# Patient Record
Sex: Female | Born: 1935 | Race: White | Hispanic: No | Smoking: Former smoker
Health system: Southern US, Community
[De-identification: ages and names within clinical notes are randomized; demographics above are authoritative.]

## PROBLEM LIST (undated history)

## (undated) DIAGNOSIS — I10 Essential (primary) hypertension: Secondary | ICD-10-CM

## (undated) DIAGNOSIS — E119 Type 2 diabetes mellitus without complications: Secondary | ICD-10-CM

## (undated) DIAGNOSIS — J449 Chronic obstructive pulmonary disease, unspecified: Secondary | ICD-10-CM

## (undated) DIAGNOSIS — M1990 Unspecified osteoarthritis, unspecified site: Secondary | ICD-10-CM

## (undated) DIAGNOSIS — N289 Disorder of kidney and ureter, unspecified: Secondary | ICD-10-CM

---

## 2016-12-28 ENCOUNTER — Emergency Department (HOSPITAL_COMMUNITY): Payer: Medicare Other

## 2016-12-28 ENCOUNTER — Inpatient Hospital Stay (HOSPITAL_COMMUNITY)
Admission: EM | Admit: 2016-12-28 | Discharge: 2017-01-04 | DRG: 194 | Disposition: A | Discharge status: Home / Self Care | Payer: Medicare Other | Attending: Internal Medicine | Admitting: Internal Medicine

## 2016-12-28 ENCOUNTER — Encounter (HOSPITAL_COMMUNITY): Payer: Self-pay | Admitting: Emergency Medicine

## 2016-12-28 DIAGNOSIS — Z7984 Long term (current) use of oral hypoglycemic drugs: Secondary | ICD-10-CM

## 2016-12-28 DIAGNOSIS — E119 Type 2 diabetes mellitus without complications: Secondary | ICD-10-CM | POA: Diagnosis not present

## 2016-12-28 DIAGNOSIS — Z7902 Long term (current) use of antithrombotics/antiplatelets: Secondary | ICD-10-CM

## 2016-12-28 DIAGNOSIS — J189 Pneumonia, unspecified organism: Principal | ICD-10-CM | POA: Diagnosis present

## 2016-12-28 DIAGNOSIS — M7989 Other specified soft tissue disorders: Secondary | ICD-10-CM | POA: Diagnosis present

## 2016-12-28 DIAGNOSIS — I10 Essential (primary) hypertension: Secondary | ICD-10-CM | POA: Diagnosis present

## 2016-12-28 DIAGNOSIS — Z881 Allergy status to other antibiotic agents status: Secondary | ICD-10-CM

## 2016-12-28 DIAGNOSIS — Z880 Allergy status to penicillin: Secondary | ICD-10-CM

## 2016-12-28 DIAGNOSIS — J440 Chronic obstructive pulmonary disease with acute lower respiratory infection: Secondary | ICD-10-CM | POA: Diagnosis present

## 2016-12-28 DIAGNOSIS — Z87891 Personal history of nicotine dependence: Secondary | ICD-10-CM

## 2016-12-28 DIAGNOSIS — H9190 Unspecified hearing loss, unspecified ear: Secondary | ICD-10-CM | POA: Diagnosis present

## 2016-12-28 DIAGNOSIS — W19XXXA Unspecified fall, initial encounter: Secondary | ICD-10-CM | POA: Diagnosis present

## 2016-12-28 DIAGNOSIS — Z79899 Other long term (current) drug therapy: Secondary | ICD-10-CM

## 2016-12-28 DIAGNOSIS — Z7982 Long term (current) use of aspirin: Secondary | ICD-10-CM

## 2016-12-28 DIAGNOSIS — R410 Disorientation, unspecified: Secondary | ICD-10-CM | POA: Diagnosis present

## 2016-12-28 DIAGNOSIS — E1151 Type 2 diabetes mellitus with diabetic peripheral angiopathy without gangrene: Secondary | ICD-10-CM | POA: Diagnosis present

## 2016-12-28 DIAGNOSIS — J449 Chronic obstructive pulmonary disease, unspecified: Secondary | ICD-10-CM | POA: Diagnosis present

## 2016-12-28 DIAGNOSIS — Z888 Allergy status to other drugs, medicaments and biological substances status: Secondary | ICD-10-CM

## 2016-12-28 DIAGNOSIS — J441 Chronic obstructive pulmonary disease with (acute) exacerbation: Secondary | ICD-10-CM | POA: Diagnosis present

## 2016-12-28 HISTORY — DX: Essential (primary) hypertension: I10

## 2016-12-28 HISTORY — DX: Disorder of kidney and ureter, unspecified: N28.9

## 2016-12-28 HISTORY — DX: Chronic obstructive pulmonary disease, unspecified: J44.9

## 2016-12-28 HISTORY — DX: Type 2 diabetes mellitus without complications: E11.9

## 2016-12-28 HISTORY — DX: Unspecified osteoarthritis, unspecified site: M19.90

## 2016-12-28 LAB — CBC WITH DIFFERENTIAL/PLATELET
Basophils Absolute: 0 10*3/uL (ref 0.0–0.1)
Basophils Relative: 0 %
EOS PCT: 0 %
Eosinophils Absolute: 0 10*3/uL (ref 0.0–0.7)
HEMATOCRIT: 39.2 % (ref 36.0–46.0)
Hemoglobin: 13.4 g/dL (ref 12.0–15.0)
LYMPHS ABS: 0.4 10*3/uL — AB (ref 0.7–4.0)
LYMPHS PCT: 5 %
MCH: 32.1 pg (ref 26.0–34.0)
MCHC: 34.2 g/dL (ref 30.0–36.0)
MCV: 94 fL (ref 78.0–100.0)
MONO ABS: 0.4 10*3/uL (ref 0.1–1.0)
MONOS PCT: 4 %
Neutro Abs: 8.6 10*3/uL — ABNORMAL HIGH (ref 1.7–7.7)
Neutrophils Relative %: 91 %
PLATELETS: 210 10*3/uL (ref 150–400)
RBC: 4.17 MIL/uL (ref 3.87–5.11)
RDW: 13.7 % (ref 11.5–15.5)
WBC: 9.4 10*3/uL (ref 4.0–10.5)

## 2016-12-28 LAB — URINALYSIS, ROUTINE W REFLEX MICROSCOPIC
Bilirubin Urine: NEGATIVE
Glucose, UA: NEGATIVE mg/dL
KETONES UR: 20 mg/dL — AB
Leukocytes, UA: NEGATIVE
Nitrite: NEGATIVE
PH: 5 (ref 5.0–8.0)
Protein, ur: 100 mg/dL — AB
SPECIFIC GRAVITY, URINE: 1.018 (ref 1.005–1.030)

## 2016-12-28 LAB — COMPREHENSIVE METABOLIC PANEL
ALBUMIN: 3.6 g/dL (ref 3.5–5.0)
ALT: 26 U/L (ref 14–54)
AST: 44 U/L — AB (ref 15–41)
Alkaline Phosphatase: 69 U/L (ref 38–126)
Anion gap: 9 (ref 5–15)
BILIRUBIN TOTAL: 1.5 mg/dL — AB (ref 0.3–1.2)
BUN: 15 mg/dL (ref 6–20)
CHLORIDE: 104 mmol/L (ref 101–111)
CO2: 23 mmol/L (ref 22–32)
Calcium: 8.5 mg/dL — ABNORMAL LOW (ref 8.9–10.3)
Creatinine, Ser: 0.81 mg/dL (ref 0.44–1.00)
GFR calc Af Amer: >60 mL/min (ref >60)
GFR calc non Af Amer: >60 mL/min (ref >60)
GLUCOSE: 156 mg/dL — AB (ref 65–99)
POTASSIUM: 3.6 mmol/L (ref 3.5–5.1)
SODIUM: 136 mmol/L (ref 135–145)
Total Protein: 7 g/dL (ref 6.5–8.1)

## 2016-12-28 LAB — I-STAT CG4 LACTIC ACID, ED: LACTIC ACID, VENOUS: 1.09 mmol/L (ref 0.5–1.9)

## 2016-12-28 LAB — BRAIN NATRIURETIC PEPTIDE: B NATRIURETIC PEPTIDE 5: 58 pg/mL (ref 0.0–100.0)

## 2016-12-28 LAB — MAGNESIUM: Magnesium: 1.8 mg/dL (ref 1.7–2.4)

## 2016-12-28 LAB — TROPONIN I: TROPONIN I: 0.03 ng/mL — AB (ref <0.03)

## 2016-12-28 MED ORDER — BENZONATATE 100 MG PO CAPS
100.0000 mg | ORAL_CAPSULE | Freq: Once | ORAL | Status: AC
  Administered 2016-12-28: 100 mg via ORAL
  Filled 2016-12-28: qty 1

## 2016-12-28 MED ORDER — IPRATROPIUM-ALBUTEROL 0.5-2.5 (3) MG/3ML IN SOLN
3.0000 mL | RESPIRATORY_TRACT | Status: DC
  Administered 2016-12-29 (×4): 3 mL via RESPIRATORY_TRACT
  Filled 2016-12-28 (×4): qty 3

## 2016-12-28 MED ORDER — DEXTROSE 5 % IV SOLN
1.0000 g | Freq: Once | INTRAVENOUS | Status: AC
  Administered 2016-12-28: 1 g via INTRAVENOUS
  Filled 2016-12-28: qty 10

## 2016-12-28 MED ORDER — ACETAMINOPHEN 325 MG PO TABS
650.0000 mg | ORAL_TABLET | Freq: Once | ORAL | Status: AC
  Administered 2016-12-28: 650 mg via ORAL
  Filled 2016-12-28: qty 2

## 2016-12-28 MED ORDER — SODIUM CHLORIDE 0.9 % IV BOLUS (SEPSIS)
1000.0000 mL | Freq: Once | INTRAVENOUS | Status: AC
  Administered 2016-12-28: 1000 mL via INTRAVENOUS

## 2016-12-28 MED ORDER — DEXTROSE 5 % IV SOLN
500.0000 mg | Freq: Once | INTRAVENOUS | Status: AC
  Administered 2016-12-28: 500 mg via INTRAVENOUS
  Filled 2016-12-28: qty 500

## 2016-12-28 MED ORDER — ASPIRIN 81 MG PO CHEW
324.0000 mg | CHEWABLE_TABLET | Freq: Once | ORAL | Status: AC
Start: 2016-12-28 — End: 2016-12-28
  Administered 2016-12-28: 324 mg via ORAL
  Filled 2016-12-28: qty 4

## 2016-12-28 MED ORDER — KETOROLAC TROMETHAMINE 30 MG/ML IJ SOLN
15.0000 mg | Freq: Once | INTRAMUSCULAR | Status: AC
  Administered 2016-12-28: 15 mg via INTRAVENOUS
  Filled 2016-12-28: qty 1

## 2016-12-28 NOTE — ED Provider Notes (Signed)
[facility]-EMER[facility]Y DEPT Provider Note   CSN: [id number] Arrival date & time: 12/28/16  2005   By signing my name below, I, Madison Curry, attest that this documentation has been prepared under the direction and in the presence of [name], Barbara Cower, MD. Electronically signed, Madison Curry, ED Scribe. 12/28/16. 8:39 PM.   History   Chief Complaint Chief Complaint  Patient presents with  . Fever   The history is provided by the patient, medical records and a relative. No language interpreter was used.    Madison Curry is a 83 y.o. female BIB EMS, with h/o HTN, DM and COPD, who presents to the Emergency Department with concern for a new, persistent fever (tMax 103 measured at home via axilla) noticed today. Associated fidgeting, cough, non focal weakness, fatigue, acute on chronic back pain exacerbated from recent falls and intermittent diarrhea x 2 days all allegedly progressing and persisting over the past 2-3 days. Pt allegedly fell out of bed 5 days ago and yesterday, per herself and relative. Wounds noted near both wrists d/t these falls. No h/o similar symptoms noted. Pt visiting from out of town; she does not where oxygen chronically. No treatments noted at home. No other modifying factors noted. Multiple sick contacts with cough. No diarrhea, frequency, significant leg swelling, chest pain, abdominal pain or any other complaints noted at this time.     Past Medical History:  Diagnosis Date  . Arthritis   . COPD (chronic obstructive pulmonary disease) (HCC)   . Diabetes mellitus without complication (HCC)   . Hypertension   . Renal disorder     Patient Active Problem List   Diagnosis Date Noted  . PNA (pneumonia) 12/28/2016  . Delirium 12/28/2016  . COPD (chronic obstructive pulmonary disease) (HCC)   . Diabetes mellitus without complication (HCC)   . Hypertension     History reviewed. No pertinent surgical history.  OB History    No data available       Home  Medications    Prior to Admission medications   Medication Sig Start Date End Date Taking? Authorizing Provider  albuterol (PROVENTIL) (2.5 MG/3ML) 0.083% nebulizer solution Take 2.5 mg by nebulization every 6 (six) hours as needed for wheezing or shortness of breath.   Yes [provider]  amLODipine (NORVASC) 5 MG tablet Take 5 mg by mouth every morning.   Yes [provider]  aspirin (GNP ADULT ASPIRIN LOW STRENGTH) 81 MG chewable tablet Chew 81 mg by mouth every evening.   Yes [provider]  azelastine (ASTELIN) 0.1 % nasal spray Place 2 sprays into both nostrils at bedtime as needed for rhinitis. Use in each nostril as directed   Yes [provider]  BIOTIN PO Take 1 capsule by mouth every evening.   Yes [provider]  calcium carbonate (CALCIUM 600) 1500 (600 Ca) MG TABS tablet Take 1 tablet by mouth 2 (two) times daily with a meal.   Yes [provider]  cetirizine (ZYRTEC) 10 MG tablet Take 10 mg by mouth every morning.   Yes [provider]  chlorpheniramine (CHLOR-TRIMETON) 4 MG tablet Take 4 mg by mouth at bedtime.   Yes [provider]  Chlorpheniramine-DM (CORICIDIN COUGH/COLD) 4-30 MG TABS Take 1-2 tablets by mouth daily as needed (coldand cough).   Yes [provider]  clopidogrel (PLAVIX) 75 MG tablet Take 75 mg by mouth every morning.   Yes [provider]  dextromethorphan-guaiFENesin (DIABETIC TUSSIN DM) 10-100 MG/5ML liquid Take 5 mLs  by mouth every 4 (four) hours as needed for cough.   Yes [provider]  donepezil (ARICEPT) 5 MG tablet Take 5 mg by mouth every evening.   Yes [provider]  Fluticasone-Salmeterol (ADVAIR) 250-50 MCG/DOSE AEPB Inhale 1 puff into the lungs every morning. *May use once additional as needed   Yes [provider]  folic acid (FOLVITE) 800 MCG tablet Take 400 mcg by mouth every evening.   Yes [provider]  gabapentin  (NEURONTIN) 100 MG capsule Take 100 mg by mouth at bedtime.   Yes [provider]  magnesium oxide (MAG-OX) 400 MG tablet Take 400 mg by mouth every morning.   Yes [provider]  Melatonin 3 MG TABS Take 3 mg by mouth at bedtime.   Yes [provider]  metFORMIN (GLUCOPHAGE) 500 MG tablet Take 250 mg by mouth daily as needed (for blood sugar levels).   Yes [provider]  methenamine (HIPREX) 1 g tablet Take 1 g by mouth every morning.   Yes [provider]  methotrexate (RHEUMATREX) 2.5 MG tablet Take 5 mg by mouth every Friday. Caution:Chemotherapy. Protect from light. EVENINGS ONLY   Yes [provider]  metolazone (ZAROXOLYN) 5 MG tablet Take 5 mg by mouth 2 (two) times a week. Mondays and Thursdays   Yes [provider]  metoprolol succinate (TOPROL-XL) 25 MG 24 hr tablet Take 25 mg by mouth every morning.   Yes [provider]  Multiple Vitamin (MULTIVITAMIN WITH MINERALS) TABS tablet Take 1 tablet by mouth every morning.   Yes [provider]  Omega-3 Fatty Acids (FISH OIL) 1200 MG CAPS Take 1 capsule by mouth every evening.   Yes [provider]  potassium chloride (K-DUR,KLOR-CON) 10 MEQ tablet Take 10 mEq by mouth 2 (two) times daily.   Yes [provider]  ranitidine (ZANTAC) 150 MG tablet Take 150 mg by mouth 2 (two) times daily.   Yes [provider]  Red Yeast Rice 600 MG CAPS Take 2 capsules by mouth every evening.   Yes [provider]  traZODone (DESYREL) 50 MG tablet Take 50 mg by mouth at bedtime.   Yes [provider]  triamcinolone (NASACORT ALLERGY 24HR) 55 MCG/ACT AERO nasal inhaler Place 2 sprays into the nose daily as needed (for allergies).   Yes [provider]    Family History History reviewed. No pertinent family history.  Social History Social History  Substance Use Topics  . Smoking status: Former Games developer  . Smokeless tobacco:  Never Used  . Alcohol use Not on file     Allergies   Doxycycline; Nitrofuran derivatives; Penicillins; and Sulfa antibiotics   Review of Systems Review of Systems  All other systems reviewed and are negative.  All other systems reviewed and all systems are negative for acute changes except as noted in the HPI and PMH.    Physical Exam Updated Vital Signs BP (!) 130/56 (BP Location: Left Arm)   Pulse 97   Temp 98.4 F (36.9 C) (Oral)   Resp (!) 21   SpO2 92%   Physical Exam  Constitutional: She is oriented to person, place, and time. She appears well-developed and well-nourished.  HENT:  Head: Normocephalic.  Eyes: Conjunctivae and EOM are normal.  Neck: Normal range of motion.  Pulmonary/Chest: Effort normal. Tachypnea noted. She has rales.  Crackles L > R bases  Abdominal: Soft. She exhibits no distension. Bowel sounds are increased. There is no tenderness.  Musculoskeletal: Normal range of motion.  T,L spine tenderness, tenderness to R wrist     Neurological: She is alert and oriented to person, place, and time.  Pt unable to follow commands  Skin: Skin is warm and dry.  Psychiatric: She has a normal mood and affect.  Nursing note and vitals reviewed.   ED Treatments / Results  DIAGNOSTIC STUDIES: Oxygen Saturation is 92% on RA, low by my interpretation.    COORDINATION OF CARE: 8:38 PM-Discussed next steps with pt. Pt verbalized understanding and is agreeable with the plan. Will order labs and medications.   Labs (all labs ordered are listed, but only abnormal results are displayed) Labs Reviewed  COMPREHENSIVE METABOLIC PANEL - Abnormal; Notable for the following:       Result Value   Glucose, Bld 156 (*)    Calcium 8.5 (*)    AST 44 (*)    Total Bilirubin 1.5 (*)    All other components within normal limits  CBC WITH DIFFERENTIAL/PLATELET - Abnormal; Notable for the following:    Neutro Abs 8.6 (*)    Lymphs Abs 0.4 (*)    All other components  within normal limits  URINALYSIS, ROUTINE W REFLEX MICROSCOPIC - Abnormal; Notable for the following:    APPearance HAZY (*)    Hgb urine dipstick MODERATE (*)    Ketones, ur 20 (*)    Protein, ur 100 (*)    Bacteria, UA RARE (*)    Squamous Epithelial / LPF 0-5 (*)    All other components within normal limits  TROPONIN I - Abnormal; Notable for the following:    Troponin I 0.03 (*)    All other components within normal limits  STREP PNEUMONIAE URINARY ANTIGEN - Abnormal; Notable for the following:    Strep Pneumo Urinary Antigen POSITIVE (*)    All other components within normal limits  BASIC METABOLIC PANEL - Abnormal; Notable for the following:    Potassium 3.2 (*)    Glucose, Bld 134 (*)    Calcium 7.9 (*)    All other components within normal limits  CBC WITH DIFFERENTIAL/PLATELET - Abnormal; Notable for the following:    RBC 3.63 (*)    Hemoglobin 11.6 (*)    HCT 34.5 (*)    All other components within normal limits  GLUCOSE, CAPILLARY - Abnormal; Notable for the following:    Glucose-Capillary 208 (*)    All other components within normal limits  GLUCOSE, CAPILLARY - Abnormal; Notable for the following:    Glucose-Capillary 111 (*)    All other components within normal limits  GLUCOSE, CAPILLARY - Abnormal; Notable for the following:    Glucose-Capillary 127 (*)    All other components within normal limits  CULTURE, BLOOD (ROUTINE X 2)  CULTURE, BLOOD (ROUTINE X 2)  BRAIN NATRIURETIC PEPTIDE  MAGNESIUM  I-STAT CG4 LACTIC ACID, ED    EKG  EKG Interpretation  Date/Time:  Tuesday Dec 28 2016 21:57:46 EDT Ventricular Rate:  92 PR Interval:    QRS Duration: 72 QT Interval:  463 QTC Calculation: 573 R Axis:   6 Text Interpretation:  Sinus rhythm Consider right atrial enlargement Low voltage, extremity and precordial leads Borderline repolarization abnormality Prolonged QT interval Confirmed by [facility] MD, Barbara Cower [phone]) on 12/28/2016 10:34:02 PM        Radiology Dg Chest 2 View  Result Date: 12/28/2016 CLINICAL DATA:  Cough.  Fever.  Diarrhea.  Concern for pneumonia. EXAM: CHEST  2 VIEW COMPARISON:  None.  FINDINGS: The heart is normal in size. Mediastinal contours are normal, atherosclerosis of the thoracic aorta. Mild interstitial coarsening which may be chronic. Patchy consolidation at the left lung base. Possible small left pleural effusion. Linear atelectasis or scarring in the periphery of the right midlung. No pulmonary edema or pneumothorax. No acute osseous abnormalities. IMPRESSION: Patchy left basilar consolidation concerning for pneumonia. Followup PA and lateral chest X-ray is recommended in 3-4 weeks following trial of antibiotic therapy to ensure resolution and exclude underlying malignancy. Thoracic aortic atherosclerosis. Electronically Signed   By: Rubye Oaks M.D.   On: 12/28/2016 21:36   Dg Wrist Complete Right  Result Date: 12/28/2016 CLINICAL DATA:  Right wrist pain after a fall today. EXAM: RIGHT WRIST - COMPLETE 3+ VIEW COMPARISON:  None. FINDINGS: Overlying IV catheter into being limits evaluation. Right wrist appears intact. No evidence of acute fracture or dislocation. No focal bone lesion or bone destruction. Mild degenerative changes in the STT and first carpometacarpal joints. Soft tissues are unremarkable. IMPRESSION: Mild degenerative changes in the right wrist. No acute bony abnormalities. Electronically Signed   By: Burman Nieves M.D.   On: 12/28/2016 22:39   Ct Head Wo Contrast  Result Date: 12/28/2016 CLINICAL DATA:  Altered mental status.  Fall several days prior EXAM: CT HEAD WITHOUT CONTRAST TECHNIQUE: Contiguous axial images were obtained from the base of the skull through the vertex without intravenous contrast. COMPARISON:  None. FINDINGS: Brain: There is moderate diffuse atrophy. There is asymmetric extra-axial fluid in the right frontal region compared to the left with localized impression on the  right frontal lobe. This apparent chronic subdural hygroma has a maximum thickness from anterior to posterior projection on axial images of 14 mm. There is no acute hemorrhage in the intra-axial or extra-axial regions. There is no midline shift. No mass or edema evident. There is extensive small vessel disease in the centra semiovale bilaterally. No acute infarct evident. Vascular: No hyperdense vessel. There is calcification in each carotid siphon region. Skull: The bony calvarium appears intact. Sinuses/Orbits: There is mucosal thickening in several ethmoid air cells bilaterally. Other visualized paranasal sinuses are clear. Visualized orbits appear symmetric bilaterally. Other: Visualized mastoid air cells are clear. IMPRESSION: Atrophy with fairly extensive periventricular small vessel disease. No intracranial mass, acute hemorrhage, or acute infarct. There is frontal atrophy bilaterally, asymmetric on the right with extra-axial fluid impressing on the right frontal lobe. It is felt that this finding represents a chronic right frontal subdural hygroma. It has a maximum thickness on axial images from anterior to posterior projection 14 mm. No acute infarct in this area. No midline shift. There are foci of arteriovascular calcification. There is mucosal thickening in several ethmoid air cells. Electronically Signed   By: Bretta Bang III M.D.   On: 12/28/2016 21:41    Procedures Procedures (including critical care time)  Medications Ordered in ED Medications  ipratropium-albuterol (DUONEB) 0.5-2.5 (3) MG/3ML nebulizer solution 3 mL (3 mLs Nebulization Given 12/29/16 1211)  amLODipine (NORVASC) tablet 5 mg (5 mg Oral Given 12/29/16 0955)  aspirin chewable tablet 81 mg (not administered)  azelastine (ASTELIN) 0.1 % nasal spray 2 spray (not administered)  clopidogrel (PLAVIX) tablet 75 mg (75 mg Oral Given 12/29/16 0955)  donepezil (ARICEPT) tablet 5 mg (not administered)  gabapentin (NEURONTIN) capsule  100 mg (100 mg Oral Given 12/29/16 0116)  metolazone (ZAROXOLYN) tablet 5 mg (not administered)  metoprolol succinate (TOPROL-XL) 24 hr tablet 25 mg (25 mg Oral Given 12/29/16 0955)  traZODone (DESYREL) tablet 50 mg (50 mg Oral Given 12/29/16 0116)  sodium chloride flush (NS) 0.9 % injection 3 mL (3 mLs Intravenous Given 12/29/16 0955)  sodium chloride flush (NS) 0.9 % injection 3 mL (not administered)  0.9 %  sodium chloride infusion (not administered)  cefTRIAXone (ROCEPHIN) 1 g in dextrose 5 % 50 mL IVPB (not administered)  azithromycin (ZITHROMAX) 500 mg in dextrose 5 % 250 mL IVPB (not administered)  insulin aspart (novoLOG) injection 0-9 Units (1 Units Subcutaneous Given 12/29/16 1234)  acetaminophen (TYLENOL) tablet 650 mg (not administered)  guaiFENesin-dextromethorphan (ROBITUSSIN DM) 100-10 MG/5ML syrup 5 mL (5 mLs Oral Given 12/29/16 1359)  methylPREDNISolone sodium succinate (SOLU-MEDROL) 40 mg/mL injection 40 mg (not administered)  albuterol (PROVENTIL) (2.5 MG/3ML) 0.083% nebulizer solution 2.5 mg (2.5 mg Nebulization Not Given 12/29/16 1435)  HYDROcodone-homatropine (HYCODAN) 5-1.5 MG/5ML syrup 5 mL (not administered)  acetaminophen (TYLENOL) tablet 650 mg (650 mg Oral Given 12/28/16 2307)  cefTRIAXone (ROCEPHIN) 1 g in dextrose 5 % 50 mL IVPB (0 g Intravenous Stopped 12/28/16 2340)  aspirin chewable tablet 324 mg (324 mg Oral Given 12/28/16 2307)  azithromycin (ZITHROMAX) 500 mg in dextrose 5 % 250 mL IVPB (0 mg Intravenous Stopped 12/29/16 0040)  sodium chloride 0.9 % bolus 1,000 mL (1,000 mLs Intravenous Transfusing/Transfer 12/29/16 0008)  ketorolac (TORADOL) 30 MG/ML injection 15 mg (15 mg Intravenous Given 12/28/16 2340)  benzonatate (TESSALON) capsule 100 mg (100 mg Oral Given 12/28/16 2340)     Initial Impression / Assessment and Plan / ED Course  I have reviewed the triage vital signs and the nursing notes.  Pertinent labs & imaging results that were available during my care of the patient  were reviewed by me and considered in my medical decision making (see chart for details).     CAP. iniital ecg with e/o qt prolongation but repeat with normal QT so given rocephin/azithromycin.  Slightly hypoxic when coughing and talking otherwise saturations > 90% on room air.  Slightly elevated troponin thought to be likely related to demand rather than primary ischemia. Aspirin given, will need trended.   Final Clinical Impressions(s) / ED Diagnoses   Final diagnoses:  Chronic obstructive pulmonary disease, unspecified COPD type (HCC)  Diabetes mellitus without complication [facility](HCC)  Essential hypertension    New Prescriptions Current Discharge Medication List    .Malena Peeratttscribe     Octave Montrose, MD 12/29/16 1447

## 2016-12-28 NOTE — ED Triage Notes (Signed)
Pt developed fever, nonproductive cough and episodes of diarrhea over last couple of days.

## 2016-12-28 NOTE — ED Notes (Signed)
Date and time results received: 12/28/16 2155 (use smartphrase ".now" to insert current time)  Test: troponin  Critical Value: 0.03  Name of Provider Notified: Dr Clayborne DanaMesner  Orders Received? Or Actions Taken?: Orders Received - See Orders for details and Actions Taken: no orders  were received.

## 2016-12-29 ENCOUNTER — Encounter (HOSPITAL_COMMUNITY): Payer: Self-pay | Admitting: *Deleted

## 2016-12-29 DIAGNOSIS — Z7984 Long term (current) use of oral hypoglycemic drugs: Secondary | ICD-10-CM | POA: Diagnosis not present

## 2016-12-29 DIAGNOSIS — Z7902 Long term (current) use of antithrombotics/antiplatelets: Secondary | ICD-10-CM | POA: Diagnosis not present

## 2016-12-29 DIAGNOSIS — Z79899 Other long term (current) drug therapy: Secondary | ICD-10-CM | POA: Diagnosis not present

## 2016-12-29 DIAGNOSIS — W19XXXA Unspecified fall, initial encounter: Secondary | ICD-10-CM | POA: Diagnosis present

## 2016-12-29 DIAGNOSIS — J441 Chronic obstructive pulmonary disease with (acute) exacerbation: Secondary | ICD-10-CM | POA: Diagnosis present

## 2016-12-29 DIAGNOSIS — Z881 Allergy status to other antibiotic agents status: Secondary | ICD-10-CM | POA: Diagnosis not present

## 2016-12-29 DIAGNOSIS — E119 Type 2 diabetes mellitus without complications: Secondary | ICD-10-CM | POA: Diagnosis present

## 2016-12-29 DIAGNOSIS — Z888 Allergy status to other drugs, medicaments and biological substances status: Secondary | ICD-10-CM | POA: Diagnosis not present

## 2016-12-29 DIAGNOSIS — J440 Chronic obstructive pulmonary disease with acute lower respiratory infection: Secondary | ICD-10-CM | POA: Diagnosis present

## 2016-12-29 DIAGNOSIS — Z87891 Personal history of nicotine dependence: Secondary | ICD-10-CM | POA: Diagnosis not present

## 2016-12-29 DIAGNOSIS — Z880 Allergy status to penicillin: Secondary | ICD-10-CM | POA: Diagnosis not present

## 2016-12-29 DIAGNOSIS — Z7982 Long term (current) use of aspirin: Secondary | ICD-10-CM | POA: Diagnosis not present

## 2016-12-29 DIAGNOSIS — I10 Essential (primary) hypertension: Secondary | ICD-10-CM

## 2016-12-29 DIAGNOSIS — H9190 Unspecified hearing loss, unspecified ear: Secondary | ICD-10-CM | POA: Diagnosis present

## 2016-12-29 DIAGNOSIS — E1151 Type 2 diabetes mellitus with diabetic peripheral angiopathy without gangrene: Secondary | ICD-10-CM | POA: Diagnosis present

## 2016-12-29 DIAGNOSIS — R410 Disorientation, unspecified: Secondary | ICD-10-CM | POA: Diagnosis present

## 2016-12-29 DIAGNOSIS — J449 Chronic obstructive pulmonary disease, unspecified: Secondary | ICD-10-CM | POA: Diagnosis not present

## 2016-12-29 DIAGNOSIS — J189 Pneumonia, unspecified organism: Secondary | ICD-10-CM | POA: Diagnosis present

## 2016-12-29 DIAGNOSIS — M7989 Other specified soft tissue disorders: Secondary | ICD-10-CM | POA: Diagnosis present

## 2016-12-29 DIAGNOSIS — R0600 Dyspnea, unspecified: Secondary | ICD-10-CM | POA: Diagnosis not present

## 2016-12-29 LAB — CBC WITH DIFFERENTIAL/PLATELET
BASOS PCT: 0 %
Basophils Absolute: 0 10*3/uL (ref 0.0–0.1)
EOS ABS: 0 10*3/uL (ref 0.0–0.7)
Eosinophils Relative: 0 %
HCT: 34.5 % — ABNORMAL LOW (ref 36.0–46.0)
Hemoglobin: 11.6 g/dL — ABNORMAL LOW (ref 12.0–15.0)
LYMPHS ABS: 0.7 10*3/uL (ref 0.7–4.0)
Lymphocytes Relative: 10 %
MCH: 32 pg (ref 26.0–34.0)
MCHC: 33.6 g/dL (ref 30.0–36.0)
MCV: 95 fL (ref 78.0–100.0)
Monocytes Absolute: 0.5 10*3/uL (ref 0.1–1.0)
Monocytes Relative: 7 %
NEUTROS PCT: 83 %
Neutro Abs: 6.5 10*3/uL (ref 1.7–7.7)
PLATELETS: 198 10*3/uL (ref 150–400)
RBC: 3.63 MIL/uL — AB (ref 3.87–5.11)
RDW: 13.6 % (ref 11.5–15.5)
WBC: 7.7 10*3/uL (ref 4.0–10.5)

## 2016-12-29 LAB — GLUCOSE, CAPILLARY
GLUCOSE-CAPILLARY: 111 mg/dL — AB (ref 65–99)
GLUCOSE-CAPILLARY: 117 mg/dL — AB (ref 65–99)
GLUCOSE-CAPILLARY: 208 mg/dL — AB (ref 65–99)
Glucose-Capillary: 127 mg/dL — ABNORMAL HIGH (ref 65–99)
Glucose-Capillary: 206 mg/dL — ABNORMAL HIGH (ref 65–99)

## 2016-12-29 LAB — BASIC METABOLIC PANEL
ANION GAP: 8 (ref 5–15)
BUN: 15 mg/dL (ref 6–20)
CO2: 25 mmol/L (ref 22–32)
CREATININE: 0.74 mg/dL (ref 0.44–1.00)
Calcium: 7.9 mg/dL — ABNORMAL LOW (ref 8.9–10.3)
Chloride: 106 mmol/L (ref 101–111)
GFR calc non Af Amer: >60 mL/min (ref >60)
Glucose, Bld: 134 mg/dL — ABNORMAL HIGH (ref 65–99)
Potassium: 3.2 mmol/L — ABNORMAL LOW (ref 3.5–5.1)
SODIUM: 139 mmol/L (ref 135–145)

## 2016-12-29 LAB — STREP PNEUMONIAE URINARY ANTIGEN: Strep Pneumo Urinary Antigen: POSITIVE — AB

## 2016-12-29 MED ORDER — SODIUM CHLORIDE 0.9% FLUSH
3.0000 mL | INTRAVENOUS | Status: DC | PRN
  Administered 2016-12-31: 3 mL via INTRAVENOUS
  Filled 2016-12-29: qty 3

## 2016-12-29 MED ORDER — DEXTROSE 5 % IV SOLN
500.0000 mg | INTRAVENOUS | Status: DC
  Administered 2016-12-30 – 2017-01-03 (×6): 500 mg via INTRAVENOUS
  Filled 2016-12-29 (×7): qty 500

## 2016-12-29 MED ORDER — DEXTROSE 5 % IV SOLN
1.0000 g | INTRAVENOUS | Status: DC
  Administered 2016-12-29 – 2017-01-03 (×6): 1 g via INTRAVENOUS
  Filled 2016-12-29 (×7): qty 10

## 2016-12-29 MED ORDER — METOPROLOL SUCCINATE ER 25 MG PO TB24
25.0000 mg | ORAL_TABLET | Freq: Every morning | ORAL | Status: DC
  Administered 2016-12-29 – 2017-01-04 (×7): 25 mg via ORAL
  Filled 2016-12-29 (×7): qty 1

## 2016-12-29 MED ORDER — METHYLPREDNISOLONE SODIUM SUCC 40 MG IJ SOLR
40.0000 mg | Freq: Four times a day (QID) | INTRAMUSCULAR | Status: DC
  Administered 2016-12-29 – 2017-01-04 (×25): 40 mg via INTRAVENOUS
  Filled 2016-12-29 (×25): qty 1

## 2016-12-29 MED ORDER — ALBUTEROL SULFATE (2.5 MG/3ML) 0.083% IN NEBU
2.5000 mg | INHALATION_SOLUTION | Freq: Four times a day (QID) | RESPIRATORY_TRACT | Status: DC
  Administered 2016-12-29 – 2017-01-04 (×21): 2.5 mg via RESPIRATORY_TRACT
  Filled 2016-12-29 (×21): qty 3

## 2016-12-29 MED ORDER — GABAPENTIN 100 MG PO CAPS
100.0000 mg | ORAL_CAPSULE | Freq: Every day | ORAL | Status: DC
  Administered 2016-12-29 – 2017-01-03 (×7): 100 mg via ORAL
  Filled 2016-12-29 (×7): qty 1

## 2016-12-29 MED ORDER — ACETAMINOPHEN 325 MG PO TABS
650.0000 mg | ORAL_TABLET | Freq: Four times a day (QID) | ORAL | Status: DC | PRN
  Administered 2017-01-04: 650 mg via ORAL
  Filled 2016-12-29: qty 2

## 2016-12-29 MED ORDER — MELATONIN 3 MG PO TABS
3.0000 mg | ORAL_TABLET | Freq: Every day | ORAL | Status: DC
Start: 2016-12-29 — End: 2016-12-29
  Filled 2016-12-29: qty 1

## 2016-12-29 MED ORDER — CLOPIDOGREL BISULFATE 75 MG PO TABS
75.0000 mg | ORAL_TABLET | Freq: Every morning | ORAL | Status: DC
  Administered 2016-12-29 – 2017-01-04 (×7): 75 mg via ORAL
  Filled 2016-12-29 (×7): qty 1

## 2016-12-29 MED ORDER — SODIUM CHLORIDE 0.9% FLUSH
3.0000 mL | Freq: Two times a day (BID) | INTRAVENOUS | Status: DC
  Administered 2016-12-29 – 2017-01-04 (×13): 3 mL via INTRAVENOUS

## 2016-12-29 MED ORDER — ALBUTEROL SULFATE (2.5 MG/3ML) 0.083% IN NEBU: 2.5000 mg | INHALATION_SOLUTION | Freq: Four times a day (QID) | RESPIRATORY_TRACT | Status: DC | PRN

## 2016-12-29 MED ORDER — AZELASTINE HCL 0.1 % NA SOLN
2.0000 | Freq: Every evening | NASAL | Status: DC | PRN
  Filled 2016-12-29: qty 30

## 2016-12-29 MED ORDER — ASPIRIN 81 MG PO CHEW
81.0000 mg | CHEWABLE_TABLET | Freq: Every evening | ORAL | Status: DC
  Administered 2016-12-29 – 2017-01-03 (×6): 81 mg via ORAL
  Filled 2016-12-29 (×6): qty 1

## 2016-12-29 MED ORDER — DONEPEZIL HCL 5 MG PO TABS
5.0000 mg | ORAL_TABLET | Freq: Every evening | ORAL | Status: DC
  Administered 2016-12-29: 5 mg via ORAL
  Filled 2016-12-29: qty 1

## 2016-12-29 MED ORDER — METOLAZONE 5 MG PO TABS
5.0000 mg | ORAL_TABLET | ORAL | Status: DC
  Administered 2016-12-30: 5 mg via ORAL
  Filled 2016-12-29: qty 1

## 2016-12-29 MED ORDER — TRAZODONE HCL 50 MG PO TABS
50.0000 mg | ORAL_TABLET | Freq: Every day | ORAL | Status: DC
  Administered 2016-12-29 – 2017-01-03 (×7): 50 mg via ORAL
  Filled 2016-12-29 (×7): qty 1

## 2016-12-29 MED ORDER — AMLODIPINE BESYLATE 5 MG PO TABS
5.0000 mg | ORAL_TABLET | Freq: Every morning | ORAL | Status: DC
  Administered 2016-12-29 – 2017-01-04 (×7): 5 mg via ORAL
  Filled 2016-12-29 (×7): qty 1

## 2016-12-29 MED ORDER — HYDROCODONE-HOMATROPINE 5-1.5 MG/5ML PO SYRP
5.0000 mL | ORAL_SOLUTION | ORAL | Status: DC | PRN
Start: 2016-12-29 — End: 2017-01-04
  Administered 2016-12-29 – 2017-01-04 (×11): 5 mL via ORAL
  Filled 2016-12-29 (×11): qty 5

## 2016-12-29 MED ORDER — INSULIN ASPART 100 UNIT/ML ~~LOC~~ SOLN
0.0000 [IU] | Freq: Three times a day (TID) | SUBCUTANEOUS | Status: DC
Start: 2016-12-29 — End: 2017-01-04
  Administered 2016-12-29: 1 [IU] via SUBCUTANEOUS
  Administered 2016-12-30: 2 [IU] via SUBCUTANEOUS
  Administered 2016-12-30: 3 [IU] via SUBCUTANEOUS
  Administered 2016-12-30: 2 [IU] via SUBCUTANEOUS
  Administered 2016-12-31: 3 [IU] via SUBCUTANEOUS
  Administered 2016-12-31 (×2): 2 [IU] via SUBCUTANEOUS
  Administered 2017-01-01: 1 [IU] via SUBCUTANEOUS
  Administered 2017-01-01: 3 [IU] via SUBCUTANEOUS
  Administered 2017-01-01 – 2017-01-03 (×5): 2 [IU] via SUBCUTANEOUS
  Administered 2017-01-03: 3 [IU] via SUBCUTANEOUS
  Administered 2017-01-03: 2 [IU] via SUBCUTANEOUS
  Administered 2017-01-04 (×2): 3 [IU] via SUBCUTANEOUS

## 2016-12-29 MED ORDER — SODIUM CHLORIDE 0.9 % IV SOLN
250.0000 mL | INTRAVENOUS | Status: DC | PRN
  Administered 2016-12-29: 250 mL via INTRAVENOUS

## 2016-12-29 MED ORDER — GUAIFENESIN-DM 100-10 MG/5ML PO SYRP
5.0000 mL | ORAL_SOLUTION | ORAL | Status: DC | PRN
  Administered 2016-12-29 (×3): 5 mL via ORAL
  Filled 2016-12-29 (×3): qty 5

## 2016-12-29 NOTE — Progress Notes (Signed)

## 2016-12-29 NOTE — H&P (Signed)
History and Physical    Madison Curry ZOX:[id number] DOB: 1931/02/05 DOA: 12/28/2016  PCP: System, Pcp Not In  Patient coming from: home  Chief Complaint:  Fever, cough  HPI: Madison Curry is a 83 y.o. female with medical history significant of HTN, DM, COPD, CKD is here visiting with daughter for a month from [place].  The last couple of days she has been coughing and running fever today.  Per daughter she has no h/o dementia however she is on aricept.  Today she was really confused which was abnormal for her.  No n/v/d.  Former smoker.  No recent abx or hospitalizations.  Pt denies any pain.  She complains of  Cough only.  No urinary symptoms, no rashes.  Pt found to have pna and referred for admission for advanced age with pneumonia.  Review of Systems: As per HPI otherwise 10 point review of systems negative per patient and daughter  Past Medical History:  Diagnosis Date  . Arthritis   . COPD (chronic obstructive pulmonary disease) (HCC)   . Diabetes mellitus without complication (HCC)   . Hypertension   . Renal disorder     History reviewed. No pertinent surgical history.   reports that she has quit smoking. She has never used smokeless tobacco. Her alcohol and drug histories are not on file. former smoker  Allergies  Allergen Reactions  . Doxycycline     unknown  . Nitrofuran Derivatives     unknown  . Penicillins     unknown  . Sulfa Antibiotics     unknown    History reviewed. No pertinent family history. no CAD premture  Prior to Admission medications   Medication Sig Start Date End Date Taking? Authorizing Provider  albuterol (PROVENTIL) (2.5 MG/3ML) 0.083% nebulizer solution Take 2.5 mg by nebulization every 6 (six) hours as needed for wheezing or shortness of breath.   Yes [provider]  amLODipine (NORVASC) 5 MG tablet Take 5 mg by mouth every morning.   Yes [provider]  aspirin (GNP ADULT ASPIRIN LOW STRENGTH) 81 MG chewable tablet  Chew 81 mg by mouth every evening.   Yes [provider]  azelastine (ASTELIN) 0.1 % nasal spray Place 2 sprays into both nostrils at bedtime as needed for rhinitis. Use in each nostril as directed   Yes [provider]  BIOTIN PO Take 1 capsule by mouth every evening.   Yes [provider]  calcium carbonate (CALCIUM 600) 1500 (600 Ca) MG TABS tablet Take 1 tablet by mouth 2 (two) times daily with a meal.   Yes [provider]  cetirizine (ZYRTEC) 10 MG tablet Take 10 mg by mouth every morning.   Yes [provider]  chlorpheniramine (CHLOR-TRIMETON) 4 MG tablet Take 4 mg by mouth at bedtime.   Yes [provider]  Chlorpheniramine-DM (CORICIDIN COUGH/COLD) 4-30 MG TABS Take 1-2 tablets by mouth daily as needed (coldand cough).   Yes [provider]  clopidogrel (PLAVIX) 75 MG tablet Take 75 mg by mouth every morning.   Yes [provider]  dextromethorphan-guaiFENesin (DIABETIC TUSSIN DM) 10-100 MG/5ML liquid Take 5 mLs by mouth every 4 (four) hours as needed for cough.   Yes [provider]  donepezil (ARICEPT) 5 MG tablet Take 5 mg by mouth every evening.   Yes [provider]  Fluticasone-Salmeterol (ADVAIR) 250-50 MCG/DOSE AEPB Inhale 1 puff into the lungs every morning. *May use once additional as needed   Yes [provider]  folic acid (FOLVITE) 800 MCG tablet Take 400 mcg by mouth every evening.   Yes [provider]  gabapentin (NEURONTIN) 100 MG capsule Take 100 mg by mouth at bedtime.   Yes [provider]  magnesium oxide (MAG-OX) 400 MG tablet Take 400 mg by mouth every morning.   Yes [provider]  Melatonin 3 MG TABS Take 3 mg by mouth at bedtime.   Yes [provider]  metFORMIN (GLUCOPHAGE) 500 MG tablet Take 250 mg by mouth daily as needed (for blood sugar levels).   Yes [provider]  methenamine (HIPREX) 1 g tablet Take 1 g by mouth  every morning.   Yes [provider]  methotrexate (RHEUMATREX) 2.5 MG tablet Take 5 mg by mouth every Friday. Caution:Chemotherapy. Protect from light. EVENINGS ONLY   Yes [provider]  metolazone (ZAROXOLYN) 5 MG tablet Take 5 mg by mouth 2 (two) times a week. Mondays and Thursdays   Yes [provider]  metoprolol succinate (TOPROL-XL) 25 MG 24 hr tablet Take 25 mg by mouth every morning.   Yes [provider]  Multiple Vitamin (MULTIVITAMIN WITH MINERALS) TABS tablet Take 1 tablet by mouth every morning.   Yes [provider]  Omega-3 Fatty Acids (FISH OIL) 1200 MG CAPS Take 1 capsule by mouth every evening.   Yes [provider]  potassium chloride (K-DUR,KLOR-CON) 10 MEQ tablet Take 10 mEq by mouth 2 (two) times daily.   Yes [provider]  ranitidine (ZANTAC) 150 MG tablet Take 150 mg by mouth 2 (two) times daily.   Yes [provider]  Red Yeast Rice 600 MG CAPS Take 2 capsules by mouth every evening.   Yes [provider]  traZODone (DESYREL) 50 MG tablet Take 50 mg by mouth at bedtime.   Yes [provider]  triamcinolone (NASACORT ALLERGY 24HR) 55 MCG/ACT AERO nasal inhaler Place 2 sprays into the nose daily as needed (for allergies).   Yes [provider]    Physical Exam: Vitals:   12/28/16 2015 12/28/16 2202 12/28/16 2316  BP: (!) 130/56  118/65  Pulse: 97  76  Resp: (!) 21  20  Temp: 98.4 F (36.9 C) (!) 102 F (38.9 C)   TempSrc: Oral Rectal   SpO2: 92%  94%     Constitutional: NAD, calm, comfortable Vitals:   12/28/16 2015 12/28/16 2202 12/28/16 2316  BP: (!) 130/56  118/65  Pulse: 97  76  Resp: (!) 21  20  Temp: 98.4 F (36.9 C) (!) 102 F (38.9 C)   TempSrc: Oral Rectal   SpO2: 92%  94%   Eyes: PERRL, lids and conjunctivae normal ENMT: Mucous membranes are moist. Posterior pharynx clear of any exudate or lesions.Normal dentition.  Neck: normal, supple, no  masses, no thyromegaly Respiratory: rhonchi bilaterally, no wheezing, no crackles. Normal respiratory effort. No accessory muscle use.  Cardiovascular: Regular rate and rhythm, no murmurs / rubs / gallops. No extremity edema. 2+ pedal pulses. No carotid bruits.  Abdomen: no tenderness, no masses palpated. No hepatosplenomegaly. Bowel sounds positive.  Musculoskeletal: no clubbing / cyanosis. No joint deformity upper and lower extremities. Good ROM, no contractures. Normal muscle tone.  Skin: no rashes, lesions, ulcers. No induration Neurologic: CN 2-12 grossly intact. Sensation intact, DTR normal. Strength 5/5 in all 4.  Psychiatric: Normal judgment and insight. Alert and oriented x 3. Normal mood.    Labs on Admission: I have personally reviewed following labs and imaging  studies  CBC:  Recent Labs Lab 12/28/16 2054  WBC 9.4  NEUTROABS 8.6*  HGB 13.4  HCT 39.2  MCV 94.0  PLT 210   Basic Metabolic Panel:  Recent Labs Lab 12/28/16 2054  NA 136  K 3.6  CL 104  CO2 23  GLUCOSE 156*  BUN 15  CREATININE 0.81  CALCIUM 8.5*  MG 1.8   GFR: CrCl cannot be calculated (Unknown ideal weight.). Liver Function Tests:  Recent Labs Lab 12/28/16 2054  AST 44*  ALT 26  ALKPHOS 69  BILITOT 1.5*  PROT 7.0  ALBUMIN 3.6   Cardiac Enzymes:  Recent Labs Lab 12/28/16 2054  TROPONINI 0.03*   Urine analysis:    Component Value Date/Time   COLORURINE YELLOW 12/28/2016 2039   APPEARANCEUR HAZY (A) 12/28/2016 2039   LABSPEC 1.018 12/28/2016 2039   PHURINE 5.0 12/28/2016 2039   GLUCOSEU NEGATIVE 12/28/2016 2039   HGBUR MODERATE (A) 12/28/2016 2039   BILIRUBINUR NEGATIVE 12/28/2016 2039   KETONESUR 20 (A) 12/28/2016 2039   PROTEINUR 100 (A) 12/28/2016 2039   NITRITE NEGATIVE 12/28/2016 2039   LEUKOCYTESUR NEGATIVE 12/28/2016 2039   Radiological Exams on Admission: Dg Chest 2 View  Result Date: 12/28/2016 CLINICAL DATA:  Cough.  Fever.  Diarrhea.  Concern for pneumonia.  EXAM: CHEST  2 VIEW COMPARISON:  None. FINDINGS: The heart is normal in size. Mediastinal contours are normal, atherosclerosis of the thoracic aorta. Mild interstitial coarsening which may be chronic. Patchy consolidation at the left lung base. Possible small left pleural effusion. Linear atelectasis or scarring in the periphery of the right midlung. No pulmonary edema or pneumothorax. No acute osseous abnormalities. IMPRESSION: Patchy left basilar consolidation concerning for pneumonia. Followup PA and lateral chest X-ray is recommended in 3-4 weeks following trial of antibiotic therapy to ensure resolution and exclude underlying malignancy. Thoracic aortic atherosclerosis. Electronically Signed   By: Rubye Oaks M.D.   On: 12/28/2016 21:36   Dg Wrist Complete Right  Result Date: 12/28/2016 CLINICAL DATA:  Right wrist pain after a fall today. EXAM: RIGHT WRIST - COMPLETE 3+ VIEW COMPARISON:  None. FINDINGS: Overlying IV catheter into being limits evaluation. Right wrist appears intact. No evidence of acute fracture or dislocation. No focal bone lesion or bone destruction. Mild degenerative changes in the STT and first carpometacarpal joints. Soft tissues are unremarkable. IMPRESSION: Mild degenerative changes in the right wrist. No acute bony abnormalities. Electronically Signed   By: Burman Nieves M.D.   On: 12/28/2016 22:39   Ct Head Wo Contrast  Result Date: 12/28/2016 CLINICAL DATA:  Altered mental status.  Fall several days prior EXAM: CT HEAD WITHOUT CONTRAST TECHNIQUE: Contiguous axial images were obtained from the base of the skull through the vertex without intravenous contrast. COMPARISON:  None. FINDINGS: Brain: There is moderate diffuse atrophy. There is asymmetric extra-axial fluid in the right frontal region compared to the left with localized impression on the right frontal lobe. This apparent chronic subdural hygroma has a maximum thickness from anterior to posterior projection on  axial images of 14 mm. There is no acute hemorrhage in the intra-axial or extra-axial regions. There is no midline shift. No mass or edema evident. There is extensive small vessel disease in the centra semiovale bilaterally. No acute infarct evident. Vascular: No hyperdense vessel. There is calcification in each carotid siphon region. Skull: The bony calvarium appears intact. Sinuses/Orbits: There is mucosal thickening in several ethmoid air cells bilaterally. Other visualized paranasal sinuses are clear. Visualized orbits appear  symmetric bilaterally. Other: Visualized mastoid air cells are clear. IMPRESSION: Atrophy with fairly extensive periventricular small vessel disease. No intracranial mass, acute hemorrhage, or acute infarct. There is frontal atrophy bilaterally, asymmetric on the right with extra-axial fluid impressing on the right frontal lobe. It is felt that this finding represents a chronic right frontal subdural hygroma. It has a maximum thickness on axial images from anterior to posterior projection 14 mm. No acute infarct in this area. No midline shift. There are foci of arteriovascular calcification. There is mucosal thickening in several ethmoid air cells. Electronically Signed   By: Bretta BangWilliam  Woodruff III M.D.   On: 12/28/2016 21:41    Assessment/Plan 83 yo female with CAP  Principal Problem:   PNA (pneumonia)- bc pending.  Iv rocephin and azithromycin.  pna pathway.  apap for fever.  Lactic level normal.  Active Problems:   Delirium- due to fever, apap   COPD (chronic obstructive pulmonary disease) (HCC)- noted, cont nebs, hold off on steroids   Diabetes mellitus without complication (HCC)-ssi   Hypertension- stable     DVT prophylaxis:  scds Code Status:  full Family Communication: daughter  Disposition Plan:  Per day team Consults called:  none Admission status:  observation   Starnisha Batrez A MD [facility]  If 7PM-7AM, please contact  night-coverage [email or web address] Password [email or web address]  12/29/2016, 12:00 AM

## 2016-12-29 NOTE — Progress Notes (Signed)
Follow up note from earlier admission by Dr Onalee Huaavid: 83 yo lives in [place], visiting her daughter, with hx of COPD, DM, CKD, admitted for CAP, along with stable HTN. She is still having significant wheezing, and will add IV steroid.  Will give some antitussive so she can rest. Continue with IV Rocephin and IV Zithromax.  She is stable.  Houston SirenPeter Varie Machamer MD FACP.  Hospitalist.

## 2016-12-30 LAB — HEPATIC FUNCTION PANEL
ALBUMIN: 3 g/dL — AB (ref 3.5–5.0)
ALT: 30 U/L (ref 14–54)
AST: 40 U/L (ref 15–41)
Alkaline Phosphatase: 62 U/L (ref 38–126)
BILIRUBIN INDIRECT: 0.4 mg/dL (ref 0.3–0.9)
BILIRUBIN TOTAL: 0.5 mg/dL (ref 0.3–1.2)
Bilirubin, Direct: 0.1 mg/dL (ref 0.1–0.5)
Total Protein: 6.4 g/dL — ABNORMAL LOW (ref 6.5–8.1)

## 2016-12-30 LAB — GLUCOSE, CAPILLARY
GLUCOSE-CAPILLARY: 194 mg/dL — AB (ref 65–99)
GLUCOSE-CAPILLARY: 207 mg/dL — AB (ref 65–99)
Glucose-Capillary: 195 mg/dL — ABNORMAL HIGH (ref 65–99)

## 2016-12-30 LAB — LIPASE, BLOOD: Lipase: 31 U/L (ref 11–51)

## 2016-12-30 MED ORDER — ONDANSETRON HCL 4 MG/2ML IJ SOLN
4.0000 mg | Freq: Four times a day (QID) | INTRAMUSCULAR | Status: DC | PRN
  Administered 2016-12-30 – 2016-12-31 (×3): 4 mg via INTRAVENOUS
  Filled 2016-12-30 (×3): qty 2

## 2016-12-30 NOTE — Progress Notes (Signed)
PROGRESS NOTE    Madison Curry  ZOX:[id number] DOB: 07-Apr-1931 DOA: 12/28/2016 PCP: System, Pcp Not In    Brief Narrative: 83 yo lives in [place], visiting her daughter, with hx of COPD, DM, CKD, admitted for CAP, along with stable HTN. She was given IV Rocephin and Zithromax, and IV steroid was added onto her regimen yesterday, as she was wheezing.  She is improving today, but is still having wheezing.    Assessment & Plan:   Principal Problem:   PNA (pneumonia) Active Problems:   Delirium   COPD (chronic obstructive pulmonary disease) (HCC)   Diabetes mellitus without complication (HCC)   Hypertension   CAP (community acquired pneumonia)   1. PNA:  Will continue with antibiotics, IV steroid and neb.  Will wean oxygen as tolerated. 2. COPD:  As above. 3. DM:  Adjusting SSI, and BSs have been undercontrolled.  4. HTN: BP is controlled. So will continue with meds.   DVT prophylaxis: SCD. Code Status: FULL CODE.  Family Communication: None.  Disposition Plan: Home.   Consultants:   None.   Procedures:   None.   Antimicrobials: Anti-infectives    Start     Dose/Rate Route Frequency Ordered Stop   12/29/16 2330  azithromycin (ZITHROMAX) 500 mg in dextrose 5 % 250 mL IVPB     500 mg 250 mL/hr over 60 Minutes Intravenous Every 24 hours 12/29/16 0028 01/05/17 2329   12/29/16 2300  cefTRIAXone (ROCEPHIN) 1 g in dextrose 5 % 50 mL IVPB     1 g 100 mL/hr over 30 Minutes Intravenous Every 24 hours 12/29/16 0028 01/05/17 2259   12/28/16 2315  azithromycin (ZITHROMAX) 500 mg in dextrose 5 % 250 mL IVPB     500 mg 250 mL/hr over 60 Minutes Intravenous  Once 12/28/16 2306 12/29/16 0040   12/28/16 2230  cefTRIAXone (ROCEPHIN) 1 g in dextrose 5 % 50 mL IVPB     1 g 100 mL/hr over 30 Minutes Intravenous  Once 12/28/16 2216 12/28/16 2340       Subjective:  Feeling a little better.  Wanting to go home.     Objective: Vitals:   12/29/16 2222 12/30/16 0253 12/30/16 0612  12/30/16 0900  BP: (!) 133/50  (!) 144/54   Pulse: 75  75   Resp: 18  18   Temp: 97.7 F (36.5 C)  97.7 F (36.5 C)   TempSrc: Oral  Oral   SpO2: 93% 96% 98% 93%  Weight:        Intake/Output Summary (Last 24 hours) at 12/30/16 1101 Last data filed at 12/30/16 0956  Gross per 24 hour  Intake           479.17 ml  Output              550 ml  Net           -70.83 ml   Filed Weights   12/29/16 0100  Weight: 63.6 kg (140 lb 3.4 oz)    Examination:  General exam: Appears calm and comfortable  Respiratory system: Bilateral wheezing but no rales.   Cardiovascular system: S1 & S2 heard, RRR. No JVD, murmurs, rubs, gallops or clicks. No pedal edema. Gastrointestinal system: Abdomen is nondistended, soft and nontender. No organomegaly or masses felt. Normal bowel sounds heard. Central nervous system: Alert and oriented. No focal neurological deficits. Extremities: Symmetric 5 x 5 power. Skin: No rashes, lesions or ulcers Psychiatry: Judgement and insight appear normal. Mood & affect appropriate.  Data Reviewed: I have personally reviewed following labs and imaging studies  CBC:  Recent Labs Lab 12/28/16 2054 12/29/16 0554  WBC 9.4 7.7  NEUTROABS 8.6* 6.5  HGB 13.4 11.6*  HCT 39.2 34.5*  MCV 94.0 95.0  PLT 210 198   Basic Metabolic Panel:  Recent Labs Lab 12/28/16 2054 12/29/16 0554  NA 136 139  K 3.6 3.2*  CL 104 106  CO2 23 25  GLUCOSE 156* 134*  BUN 15 15  CREATININE 0.81 0.74  CALCIUM 8.5* 7.9*  MG 1.8  --    Liver Function Tests:  Recent Labs Lab 12/28/16 2054  AST 44*  ALT 26  ALKPHOS 69  BILITOT 1.5*  PROT 7.0  ALBUMIN 3.6   Cardiac Enzymes:  Recent Labs Lab 12/28/16 2054  TROPONINI 0.03*    Recent Labs Lab 12/29/16 1204 12/29/16 1700 12/29/16 2109 12/30/16 0755 12/30/16 1057  GLUCAP 127* 117* 206* 194* 207*   Lipid Profile: Sepsis Labs:  Recent Labs Lab 12/28/16 2110  LATICACIDVEN 1.09    Recent Results (from the  past 240 hour(s))  Blood Culture (routine x 2)     Status: None (Preliminary result)   Collection Time: 12/28/16  8:54 PM  Result Value Ref Range Status   Specimen Description RIGHT ANTECUBITAL  Final   Special Requests   Final    BOTTLES DRAWN AEROBIC AND ANAEROBIC Blood Culture adequate volume   Culture NO GROWTH 2 DAYS  Final   Report Status PENDING  Incomplete  Blood Culture (routine x 2)     Status: None (Preliminary result)   Collection Time: 12/28/16  9:06 PM  Result Value Ref Range Status   Specimen Description LEFT ANTECUBITAL  Final   Special Requests   Final    BOTTLES DRAWN AEROBIC ONLY Blood Culture adequate volume   Culture NO GROWTH 2 DAYS  Final   Report Status PENDING  Incomplete     Radiology Studies: Dg Chest 2 View  Result Date: 12/28/2016 CLINICAL DATA:  Cough.  Fever.  Diarrhea.  Concern for pneumonia. EXAM: CHEST  2 VIEW COMPARISON:  None. FINDINGS: The heart is normal in size. Mediastinal contours are normal, atherosclerosis of the thoracic aorta. Mild interstitial coarsening which may be chronic. Patchy consolidation at the left lung base. Possible small left pleural effusion. Linear atelectasis or scarring in the periphery of the right midlung. No pulmonary edema or pneumothorax. No acute osseous abnormalities. IMPRESSION: Patchy left basilar consolidation concerning for pneumonia. Followup PA and lateral chest X-ray is recommended in 3-4 weeks following trial of antibiotic therapy to ensure resolution and exclude underlying malignancy. Thoracic aortic atherosclerosis. Electronically Signed   By: Rubye OaksMelanie  Ehinger M.D.   On: 12/28/2016 21:36   Dg Wrist Complete Right  Result Date: 12/28/2016 CLINICAL DATA:  Right wrist pain after a fall today. EXAM: RIGHT WRIST - COMPLETE 3+ VIEW COMPARISON:  None. FINDINGS: Overlying IV catheter into being limits evaluation. Right wrist appears intact. No evidence of acute fracture or dislocation. No focal bone lesion or bone  destruction. Mild degenerative changes in the STT and first carpometacarpal joints. Soft tissues are unremarkable. IMPRESSION: Mild degenerative changes in the right wrist. No acute bony abnormalities. Electronically Signed   By: Burman NievesWilliam  Stevens M.D.   On: 12/28/2016 22:39   Ct Head Wo Contrast  Result Date: 12/28/2016 CLINICAL DATA:  Altered mental status.  Fall several days prior EXAM: CT HEAD WITHOUT CONTRAST TECHNIQUE: Contiguous axial images were obtained from the base of the skull  through the vertex without intravenous contrast. COMPARISON:  None. FINDINGS: Brain: There is moderate diffuse atrophy. There is asymmetric extra-axial fluid in the right frontal region compared to the left with localized impression on the right frontal lobe. This apparent chronic subdural hygroma has a maximum thickness from anterior to posterior projection on axial images of 14 mm. There is no acute hemorrhage in the intra-axial or extra-axial regions. There is no midline shift. No mass or edema evident. There is extensive small vessel disease in the centra semiovale bilaterally. No acute infarct evident. Vascular: No hyperdense vessel. There is calcification in each carotid siphon region. Skull: The bony calvarium appears intact. Sinuses/Orbits: There is mucosal thickening in several ethmoid air cells bilaterally. Other visualized paranasal sinuses are clear. Visualized orbits appear symmetric bilaterally. Other: Visualized mastoid air cells are clear. IMPRESSION: Atrophy with fairly extensive periventricular small vessel disease. No intracranial mass, acute hemorrhage, or acute infarct. There is frontal atrophy bilaterally, asymmetric on the right with extra-axial fluid impressing on the right frontal lobe. It is felt that this finding represents a chronic right frontal subdural hygroma. It has a maximum thickness on axial images from anterior to posterior projection 14 mm. No acute infarct in this area. No midline shift.  There are foci of arteriovascular calcification. There is mucosal thickening in several ethmoid air cells. Electronically Signed   By: Bretta Bang III M.D.   On: 12/28/2016 21:41    Scheduled Meds: . albuterol  2.5 mg Nebulization Q6H  . amLODipine  5 mg Oral q morning - 10a  . aspirin  81 mg Oral QPM  . clopidogrel  75 mg Oral q morning - 10a  . donepezil  5 mg Oral QPM  . gabapentin  100 mg Oral QHS  . insulin aspart  0-9 Units Subcutaneous TID WC  . methylPREDNISolone (SOLU-MEDROL) injection  40 mg Intravenous Q6H  . metolazone  5 mg Oral Once per day on Mon Thu  . metoprolol succinate  25 mg Oral q morning - 10a  . sodium chloride flush  3 mL Intravenous Q12H  . traZODone  50 mg Oral QHS   Continuous Infusions: . sodium chloride 250 mL (12/29/16 2315)  . azithromycin Stopped (12/30/16 0100)  . cefTRIAXone (ROCEPHIN)  IV Stopped (12/29/16 2345)     LOS: 1 day   Crispin Vogel, MD FACP Hospitalist.   If 7PM-7AM, please contact night-coverage [email or web address] Password [email or web address] 12/30/2016, 11:01 AM

## 2016-12-31 LAB — GLUCOSE, CAPILLARY
GLUCOSE-CAPILLARY: 175 mg/dL — AB (ref 65–99)
Glucose-Capillary: 185 mg/dL — ABNORMAL HIGH (ref 65–99)
Glucose-Capillary: 177 mg/dL — ABNORMAL HIGH (ref 65–99)
Glucose-Capillary: 213 mg/dL — ABNORMAL HIGH (ref 65–99)
Glucose-Capillary: 192 mg/dL — ABNORMAL HIGH (ref 65–99)

## 2016-12-31 MED ORDER — ORAL CARE MOUTH RINSE
15.0000 mL | Freq: Two times a day (BID) | OROMUCOSAL | Status: DC
  Administered 2016-12-31 – 2017-01-04 (×9): 15 mL via OROMUCOSAL

## 2016-12-31 MED ORDER — GUAIFENESIN 100 MG/5ML PO SOLN
10.0000 mL | ORAL | Status: DC | PRN
  Administered 2016-12-31: 200 mg via ORAL
  Filled 2016-12-31: qty 5

## 2016-12-31 NOTE — Care Management Note (Addendum)
Case Management Note  Patient Details  Name: Madison Curry MRN: [id number] Date of Birth: [birth year]  Subjective/Objective:   Adm with PNA, hx of COPD. From home with daughter, lives in [place]. Here visiting another daughter. Ind PTA. Currently on oxygen, no oxygen PTA. CM ordered Home O2 eval, but unable to be done yet as patient is having "coughing spells" and "can't breathe".  CM discussed if patient needed oxygen, we could arrange. Patient plans to be here in [place] until June 20th. If patient needs oxygen at time of discharge, [facility] can provide and the hope is that patient will no longer need O2 when ready to travel back to [place].          Action/Plan: CM following for needs.  If patient Discharges over the weekend, see treatment sticky notes of information needed and where to fax orders.  Expected Discharge Date:   01/02/2017               Expected Discharge Plan:  Home/Self Care  In-House Referral:     Discharge planning Services  CM Consult  Post Acute Care Choice:    Choice offered to:     DME Arranged:    DME Agency:     HH Arranged:    HH Agency:     Status of Service:  In process, will continue to follow  If discussed at Long Length of Stay Meetings, dates discussed:    Additional Comments:  Anapaola Kinsel, Chrystine OilerSharley Diane, RN 12/31/2016, 3:27 PM

## 2016-12-31 NOTE — Care Management Important Message (Signed)
Important Message  Patient Details  Name: Madison Curry MRN: [id number] Date of Birth: September 19, 1930   Medicare Important Message Given:  Yes    Hoyt Leanos, Chrystine OilerSharley Diane, RN 12/31/2016, 3:34 PM

## 2016-12-31 NOTE — Progress Notes (Signed)
PROGRESS NOTE    Madison Curry  ZOX:[id number] DOB: Mar 17, 1931 DOA: 12/28/2016 PCP: System, Pcp Not In    Brief Narrative:  83 [place] her [place], with hx of COPD, DM, CKD, admitted for CAP, along with stable HTN. She was given IV Rocephin and Zithromax, and IV steroid was added onto her regimen yesterday, as she was wheezing.  She is improving today, but is still having wheezing. No new event.    Assessment & Plan:   Principal Problem:   PNA (pneumonia) Active Problems:   Delirium   COPD (chronic obstructive pulmonary disease) (HCC)   Diabetes mellitus without complication (HCC)   Hypertension   CAP (community acquired pneumonia)  1. PNA:  Will continue with antibiotics, IV steroid and neb.  Need to evaluate her for home oxygen.  Will continue with same tx today.  2. COPD:  As above. 3. DM:  Adjusting SSI, and BSs have been undercontrolled.  4. HTN: BP is controlled. So will continue with meds.  DVT prophylaxis: SCD. Code Status: FULL  Family Communication: None.  Disposition Plan: Home.   Consultants:   None.   Procedures:   None.   Antimicrobials: Anti-infectives    Start     Dose/Rate Route Frequency Ordered Stop   12/29/16 2330  azithromycin (ZITHROMAX) 500 mg in dextrose 5 % 250 mL IVPB     500 mg 250 mL/hr over 60 Minutes Intravenous Every 24 hours 12/29/16 0028 01/05/17 2329   12/29/16 2300  cefTRIAXone (ROCEPHIN) 1 g in dextrose 5 % 50 mL IVPB     1 g 100 mL/hr over 30 Minutes Intravenous Every 24 hours 12/29/16 0028 01/05/17 2259   12/28/16 2315  azithromycin (ZITHROMAX) 500 mg in dextrose 5 % 250 mL IVPB     500 mg 250 mL/hr over 60 Minutes Intravenous  Once 12/28/16 2306 12/29/16 0040   12/28/16 2230  cefTRIAXone (ROCEPHIN) 1 g in dextrose 5 % 50 mL IVPB     1 g 100 mL/hr over 30 Minutes Intravenous  Once 12/28/16 2216 12/28/16 2340       Subjective:  I am feeling better.  Objective: Vitals:   12/31/16 0139 12/31/16  0400 12/31/16 0500 12/31/16 0748  BP:  (!) 124/46    Pulse:  70    Resp:  18    Temp:  97.5 F (36.4 C)    TempSrc:  Oral    SpO2: 94% 96%  91%  Weight:      Height:   5\' 2"  (1.575 m)     Intake/Output Summary (Last 24 hours) at 12/31/16 1129 Last data filed at 12/31/16 1000  Gross per 24 hour  Intake             1558 ml  Output              450 ml  Net             1108 ml   Filed Weights   12/29/16 0100  Weight: 63.6 kg (140 lb 3.4 oz)    Examination:  General exam: Appears calm and comfortable  Respiratory system: Some wheezing audible. Respiratory effort normal. Cardiovascular system: S1 & S2 heard, RRR. No JVD, murmurs, rubs, gallops or clicks. No pedal edema. Gastrointestinal system: Abdomen is nondistended, soft and nontender. No organomegaly or masses felt. Normal bowel sounds heard. Central nervous system: Alert and oriented. No focal neurological deficits. Extremities: Symmetric 5 x 5 power. Skin: No rashes, lesions or  ulcers Psychiatry: Judgement and insight appear normal. Mood & affect appropriate.   Data Reviewed: I have personally reviewed following labs and imaging studies  CBC:  Recent Labs Lab 12/28/16 2054 12/29/16 0554  WBC 9.4 7.7  NEUTROABS 8.6* 6.5  HGB 13.4 11.6*  HCT 39.2 34.5*  MCV 94.0 95.0  PLT 210 198   Basic Metabolic Panel:  Recent Labs Lab 12/28/16 2054 12/29/16 0554  NA 136 139  K 3.6 3.2*  CL 104 106  CO2 23 25  GLUCOSE 156* 134*  BUN 15 15  CREATININE 0.81 0.74  CALCIUM 8.5* 7.9*  MG 1.8  --    GFR: Estimated Creatinine Clearance: 46.7 mL/min (by C-G formula based on SCr of 0.74 mg/dL). Liver Function Tests:  Recent Labs Lab 12/28/16 2054 12/30/16 1301  AST 44* 40  ALT 26 30  ALKPHOS 69 62  BILITOT 1.5* 0.5  PROT 7.0 6.4*  ALBUMIN 3.6 3.0*    Recent Labs Lab 12/30/16 1301  LIPASE 31   Cardiac Enzymes:  Recent Labs Lab 12/28/16 2054  TROPONINI 0.03*   CBG:  Recent Labs Lab 12/30/16 0755  12/30/16 1057 12/30/16 1630 12/30/16 2133 12/31/16 0759  GLUCAP 194* 207* 175* 195* 185*   Sepsis Labs:  Recent Labs Lab 12/28/16 2110  LATICACIDVEN 1.09    Recent Results (from the past 240 hour(s))  Blood Culture (routine x 2)     Status: None (Preliminary result)   Collection Time: 12/28/16  8:54 PM  Result Value Ref Range Status   Specimen Description RIGHT ANTECUBITAL  Final   Special Requests   Final    BOTTLES DRAWN AEROBIC AND ANAEROBIC Blood Culture adequate volume   Culture NO GROWTH 3 DAYS  Final   Report Status PENDING  Incomplete  Blood Culture (routine x 2)     Status: None (Preliminary result)   Collection Time: 12/28/16  9:06 PM  Result Value Ref Range Status   Specimen Description LEFT ANTECUBITAL  Final   Special Requests   Final    BOTTLES DRAWN AEROBIC ONLY Blood Culture adequate volume   Culture NO GROWTH 3 DAYS  Final   Report Status PENDING  Incomplete     Radiology Studies: No results found.  Scheduled Meds: . albuterol  2.5 mg Nebulization Q6H  . amLODipine  5 mg Oral q morning - 10a  . aspirin  81 mg Oral QPM  . clopidogrel  75 mg Oral q morning - 10a  . gabapentin  100 mg Oral QHS  . insulin aspart  0-9 Units Subcutaneous TID WC  . mouth rinse  15 mL Mouth Rinse BID  . methylPREDNISolone (SOLU-MEDROL) injection  40 mg Intravenous Q6H  . metolazone  5 mg Oral Once per day on Mon Thu  . metoprolol succinate  25 mg Oral q morning - 10a  . sodium chloride flush  3 mL Intravenous Q12H  . traZODone  50 mg Oral QHS   Continuous Infusions: . sodium chloride Stopped (12/31/16 0233)  . azithromycin Stopped (12/31/16 0020)  . cefTRIAXone (ROCEPHIN)  IV Stopped (12/30/16 2303)     LOS: 2 days   Bart Ashford, MD FACP Hospitalist.   If 7PM-7AM, please contact night-coverage [email or web address] Password [facility] 12/31/2016, 11:29 AM

## 2017-01-01 LAB — GLUCOSE, CAPILLARY
GLUCOSE-CAPILLARY: 194 mg/dL — AB (ref 65–99)
GLUCOSE-CAPILLARY: 138 mg/dL — AB (ref 65–99)
GLUCOSE-CAPILLARY: 219 mg/dL — AB (ref 65–99)
Glucose-Capillary: 205 mg/dL — ABNORMAL HIGH (ref 65–99)

## 2017-01-01 MED ORDER — GUAIFENESIN ER 600 MG PO TB12
1200.0000 mg | ORAL_TABLET | Freq: Two times a day (BID) | ORAL | Status: DC
  Administered 2017-01-01 – 2017-01-04 (×7): 1200 mg via ORAL
  Filled 2017-01-01 (×7): qty 2

## 2017-01-01 MED ORDER — FLUTICASONE FUROATE-VILANTEROL 100-25 MCG/INH IN AEPB
1.0000 | INHALATION_SPRAY | Freq: Every day | RESPIRATORY_TRACT | Status: DC
  Administered 2017-01-01 – 2017-01-04 (×4): 1 via RESPIRATORY_TRACT
  Filled 2017-01-01: qty 28

## 2017-01-01 MED ORDER — UMECLIDINIUM BROMIDE 62.5 MCG/INH IN AEPB
1.0000 | INHALATION_SPRAY | Freq: Every day | RESPIRATORY_TRACT | Status: DC
  Administered 2017-01-01 – 2017-01-04 (×4): 1 via RESPIRATORY_TRACT
  Filled 2017-01-01: qty 7

## 2017-01-01 NOTE — Consult Note (Signed)
Consult requested by: Dr. Conley Rolls Consult requested for: Pneumonia/COPD exacerbation  HPI: This is an 83 year old who has a history of COPD and who was admitted with community-acquired pneumonia. She says that she stopped smoking in 1977 but started in the 50s. She does see a pulmonologist at home. She lives in [place] and is here visiting her daughter. She has been on treatment for COPD and pneumonia but has not cleared and still has significant cough and wheezing. She denies any chest pain. No nausea vomiting diarrhea. No abdominal pain. She is coughing mostly nonproductively. No hemoptysis. She does have difficulty hearing.  Past Medical History:  Diagnosis Date  . Arthritis   . COPD (chronic obstructive pulmonary disease) (HCC)   . Diabetes mellitus without complication (HCC)   . Hypertension   . Renal disorder      History reviewed. No pertinent family history. No family history of COPD  Social History   Social History  . Marital status: Widowed    Spouse name: N/A  . Number of children: N/A  . Years of education: N/A   Social History Main Topics  . Smoking status: Former Games developer  . Smokeless tobacco: Never Used  . Alcohol use None  . Drug use: Unknown  . Sexual activity: Not Asked   Other Topics Concern  . None   Social History Narrative  . None     ROS: Except as mentioned 10 point review of systems is negative    Objective: Vital signs in last 24 hours: Temp:  [97.5 F (36.4 C)-97.9 F (36.6 C)] 97.5 F (36.4 C) (05/12 0450) Pulse Rate:  [58-67] 58 (05/12 0450) Resp:  [18-20] 20 (05/12 0450) BP: (115-155)/(49-57) 130/51 (05/12 0916) SpO2:  [93 %-96 %] 96 % (05/12 0714) Weight change:  Last BM Date: 12/30/16  Intake/Output from previous day: 05/11 0701 - 05/12 0700 In: 606 [P.O.:600; I.V.:6] Out: 1000 [Urine:1000]  PHYSICAL EXAM Constitutional: She is awake and alert and in no acute distress. She is coughing during the examination eyes: Pupils react.  EOMI. Ears nose mouth and throat: She is very hard of hearing. Her throat is clear. Cardiovascular: Her heart is regular with normal heart sounds. No edema. Respiratory: She has wheezing bilaterally and coughing during the exam. Skin: Warm and dry. Musculoskeletal: Normal strength. Neurological: No focal abnormalities. Psychiatric: Normal mood and affect  Lab Results: Basic Metabolic Panel: No results for input(s): NA, K, CL, CO2, GLUCOSE, BUN, CREATININE, CALCIUM, MG, PHOS in the last 72 hours. Liver Function Tests:  Recent Labs  12/30/16 1301  AST 40  ALT 30  ALKPHOS 62  BILITOT 0.5  PROT 6.4*  ALBUMIN 3.0*    Recent Labs  12/30/16 1301  LIPASE 31   No results for input(s): AMMONIA in the last 72 hours. CBC: No results for input(s): WBC, NEUTROABS, HGB, HCT, MCV, PLT in the last 72 hours. Cardiac Enzymes: No results for input(s): CKTOTAL, CKMB, CKMBINDEX, TROPONINI in the last 72 hours. BNP: No results for input(s): PROBNP in the last 72 hours. D-Dimer: No results for input(s): DDIMER in the last 72 hours. CBG:  Recent Labs  12/30/16 2133 12/31/16 0759 12/31/16 1205 12/31/16 1711 12/31/16 2155 01/01/17 0727  GLUCAP 195* 185* 177* 213* 192* 194*   Hemoglobin A1C: No results for input(s): HGBA1C in the last 72 hours. Fasting Lipid Panel: No results for input(s): CHOL, HDL, LDLCALC, TRIG, CHOLHDL, LDLDIRECT in the last 72 hours. Thyroid Function Tests: No results for input(s): TSH, T4TOTAL, FREET4, T3FREE, THYROIDAB  in the last 72 hours. Anemia Panel: No results for input(s): VITAMINB12, FOLATE, FERRITIN, TIBC, IRON, RETICCTPCT in the last 72 hours. Coagulation: No results for input(s): LABPROT, INR in the last 72 hours. Urine Drug Screen: Drugs of Abuse  No results found for: LABOPIA, COCAINSCRNUR, LABBENZ, AMPHETMU, THCU, LABBARB  Alcohol Level: No results for input(s): ETH in the last 72 hours. Urinalysis: No results for input(s): COLORURINE, LABSPEC,  PHURINE, GLUCOSEU, HGBUR, BILIRUBINUR, KETONESUR, PROTEINUR, UROBILINOGEN, NITRITE, LEUKOCYTESUR in the last 72 hours.  Invalid input(s): APPERANCEUR Misc. Labs:   ABGS: No results for input(s): PHART, PO2ART, TCO2, HCO3 in the last 72 hours.  Invalid input(s): PCO2   MICROBIOLOGY: Recent Results (from the past 240 hour(s))  Blood Culture (routine x 2)     Status: None (Preliminary result)   Collection Time: 12/28/16  8:54 PM  Result Value Ref Range Status   Specimen Description RIGHT ANTECUBITAL  Final   Special Requests   Final    BOTTLES DRAWN AEROBIC AND ANAEROBIC Blood Culture adequate volume   Culture NO GROWTH 3 DAYS  Final   Report Status PENDING  Incomplete  Blood Culture (routine x 2)     Status: None (Preliminary result)   Collection Time: 12/28/16  9:06 PM  Result Value Ref Range Status   Specimen Description LEFT ANTECUBITAL  Final   Special Requests   Final    BOTTLES DRAWN AEROBIC ONLY Blood Culture adequate volume   Culture NO GROWTH 3 DAYS  Final   Report Status PENDING  Incomplete    Studies/Results: No results found.  Medications:  Prior to Admission:  Prescriptions Prior to Admission  Medication Sig Dispense Refill Last Dose  . albuterol (PROVENTIL) (2.5 MG/3ML) 0.083% nebulizer solution Take 2.5 mg by nebulization every 6 (six) hours as needed for wheezing or shortness of breath.   unknown  . amLODipine (NORVASC) 5 MG tablet Take 5 mg by mouth every morning.   12/28/2016 at Unknown time  . aspirin (GNP ADULT ASPIRIN LOW STRENGTH) 81 MG chewable tablet Chew 81 mg by mouth every evening.   12/27/2016 at Unknown time  . azelastine (ASTELIN) 0.1 % nasal spray Place 2 sprays into both nostrils at bedtime as needed for rhinitis. Use in each nostril as directed   unknown  . BIOTIN PO Take 1 capsule by mouth every evening.   12/27/2016 at Unknown time  . calcium carbonate (CALCIUM 600) 1500 (600 Ca) MG TABS tablet Take 1 tablet by mouth 2 (two) times daily with a  meal.   12/28/2016 at Unknown time  . cetirizine (ZYRTEC) 10 MG tablet Take 10 mg by mouth every morning.   12/28/2016 at Unknown time  . chlorpheniramine (CHLOR-TRIMETON) 4 MG tablet Take 4 mg by mouth at bedtime.   12/27/2016 at Unknown time  . Chlorpheniramine-DM (CORICIDIN COUGH/COLD) 4-30 MG TABS Take 1-2 tablets by mouth daily as needed (coldand cough).   12/28/2016 at Unknown time  . clopidogrel (PLAVIX) 75 MG tablet Take 75 mg by mouth every morning.   12/28/2016 at Unknown time  . dextromethorphan-guaiFENesin (DIABETIC TUSSIN DM) 10-100 MG/5ML liquid Take 5 mLs by mouth every 4 (four) hours as needed for cough.   Past Week at Unknown time  . donepezil (ARICEPT) 5 MG tablet Take 5 mg by mouth every evening.   12/27/2016 at Unknown time  . Fluticasone-Salmeterol (ADVAIR) 250-50 MCG/DOSE AEPB Inhale 1 puff into the lungs every morning. *May use once additional as needed   12/28/2016 at Unknown time  .  folic acid (FOLVITE) 800 MCG tablet Take 400 mcg by mouth every evening.   12/27/2016 at Unknown time  . gabapentin (NEURONTIN) 100 MG capsule Take 100 mg by mouth at bedtime.   12/27/2016 at Unknown time  . magnesium oxide (MAG-OX) 400 MG tablet Take 400 mg by mouth every morning.   12/28/2016 at Unknown time  . Melatonin 3 MG TABS Take 3 mg by mouth at bedtime.   12/27/2016 at Unknown time  . metFORMIN (GLUCOPHAGE) 500 MG tablet Take 250 mg by mouth daily as needed (for blood sugar levels).   unknown  . methenamine (HIPREX) 1 g tablet Take 1 g by mouth every morning.   12/28/2016 at Unknown time  . methotrexate (RHEUMATREX) 2.5 MG tablet Take 5 mg by mouth every Friday. Caution:Chemotherapy. Protect from light. EVENINGS ONLY   12/24/2016 at Unknown time  . metolazone (ZAROXOLYN) 5 MG tablet Take 5 mg by mouth 2 (two) times a week. Mondays and Thursdays   12/27/2016 at Unknown time  . metoprolol succinate (TOPROL-XL) 25 MG 24 hr tablet Take 25 mg by mouth every morning.   12/28/2016 at Unknown time  . Multiple Vitamin  (MULTIVITAMIN WITH MINERALS) TABS tablet Take 1 tablet by mouth every morning.   12/28/2016 at Unknown time  . Omega-3 Fatty Acids (FISH OIL) 1200 MG CAPS Take 1 capsule by mouth every evening.   12/27/2016 at Unknown time  . potassium chloride (K-DUR,KLOR-CON) 10 MEQ tablet Take 10 mEq by mouth 2 (two) times daily.   12/28/2016 at Unknown time  . ranitidine (ZANTAC) 150 MG tablet Take 150 mg by mouth 2 (two) times daily.   12/28/2016 at Unknown time  . Red Yeast Rice 600 MG CAPS Take 2 capsules by mouth every evening.   12/27/2016 at Unknown time  . traZODone (DESYREL) 50 MG tablet Take 50 mg by mouth at bedtime.   12/27/2016 at Unknown time  . triamcinolone (NASACORT ALLERGY 24HR) 55 MCG/ACT AERO nasal inhaler Place 2 sprays into the nose daily as needed (for allergies).   unknown   Scheduled: . albuterol  2.5 mg Nebulization Q6H  . amLODipine  5 mg Oral q morning - 10a  . aspirin  81 mg Oral QPM  . clopidogrel  75 mg Oral q morning - 10a  . fluticasone furoate-vilanterol  1 puff Inhalation Daily  . gabapentin  100 mg Oral QHS  . guaiFENesin  1,200 mg Oral BID  . insulin aspart  0-9 Units Subcutaneous TID WC  . mouth rinse  15 mL Mouth Rinse BID  . methylPREDNISolone (SOLU-MEDROL) injection  40 mg Intravenous Q6H  . metolazone  5 mg Oral Once per day on Mon Thu  . metoprolol succinate  25 mg Oral q morning - 10a  . sodium chloride flush  3 mL Intravenous Q12H  . traZODone  50 mg Oral QHS  . umeclidinium bromide  1 puff Inhalation Daily   Continuous: . sodium chloride Stopped (12/31/16 0233)  . azithromycin Stopped (01/01/17 0106)  . cefTRIAXone (ROCEPHIN)  IV Stopped (12/31/16 2351)   UJW:JXBJYN chloride, acetaminophen, azelastine, HYDROcodone-homatropine, ondansetron (ZOFRAN) IV, sodium chloride flush  Assesment: She was admitted with pneumonia and COPD exacerbation. She has not responded thus far to treatment. Principal Problem:   PNA (pneumonia) Active Problems:   Delirium   COPD  (chronic obstructive pulmonary disease) (HCC)   Diabetes mellitus without complication (HCC)   Hypertension   CAP (community acquired pneumonia)    Plan: Continue IV steroids and IV  antibiotics. I added inhalers. Switch her from Robitussin and Mucinex. Add a flutter valve.    LOS: 3 days   Alexius Hangartner L 01/01/2017, 10:23 AM

## 2017-01-01 NOTE — Progress Notes (Signed)
PROGRESS NOTE    Madison Curry  ZOX:[id number] DOB: Dec 02, 1930 DOA: 12/28/2016 PCP: System, Pcp Not In    Brief Narrative:  83 yo lives in [place], visiting her daughter, with hx of COPD, DM, CKD, admitted for CAP, along with stable HTN.She was given IV Rocephin and Zithromax, and IV steroid was added onto her regimen yesterday, as she was wheezing. She is improving today, but is still having wheezing. No new event.   Assessment & Plan:   Principal Problem:   PNA (pneumonia) Active Problems:   Delirium   COPD (chronic obstructive pulmonary disease) (HCC)   Diabetes mellitus without complication (HCC)   Hypertension   CAP (community acquired pneumonia)  1. PNA: Will continue with antibiotics, IV steroid and neb.  Need to evaluate her for home oxygen.  Will continue with same tx today. She is still wheezing significantly, so I can't send her home today.  Will ask Dr Adah Perl to see if he has any further recommendation.  2. COPD: As above. 3. DM: Adjusting SSI, and BSs have been undercontrolled.  4. HTN: BP is controlled. So will continue with meds.   DVT prophylaxis: SCD Code Status: FULL Family Communication: None.  Disposition Plan: Home.   Consultants:   None.   Procedures:   None.   Antimicrobials: Anti-infectives    Start     Dose/Rate Route Frequency Ordered Stop   12/29/16 2330  azithromycin (ZITHROMAX) 500 mg in dextrose 5 % 250 mL IVPB     500 mg 250 mL/hr over 60 Minutes Intravenous Every 24 hours 12/29/16 0028 01/05/17 2329   12/29/16 2300  cefTRIAXone (ROCEPHIN) 1 g in dextrose 5 % 50 mL IVPB     1 g 100 mL/hr over 30 Minutes Intravenous Every 24 hours 12/29/16 0028 01/05/17 2259   12/28/16 2315  azithromycin (ZITHROMAX) 500 mg in dextrose 5 % 250 mL IVPB     500 mg 250 mL/hr over 60 Minutes Intravenous  Once 12/28/16 2306 12/29/16 0040   12/28/16 2230  cefTRIAXone (ROCEPHIN) 1 g in dextrose 5 % 50 mL IVPB     1 g 100 mL/hr over 30 Minutes  Intravenous  Once 12/28/16 2216 12/28/16 2340       Subjective:   Want to go home.    Objective: Vitals:   01/01/17 0216 01/01/17 0450 01/01/17 0714 01/01/17 0916  BP:  (!) 155/52  (!) 130/51  Pulse:  (!) 58    Resp:  20    Temp:  97.5 F (36.4 C)    TempSrc:  Oral    SpO2: 93% 96% 96%   Weight:      Height:        Intake/Output Summary (Last 24 hours) at 01/01/17 0945 Last data filed at 01/01/17 0916  Gross per 24 hour  Intake              609 ml  Output             1200 ml  Net             -591 ml   Filed Weights   12/29/16 0100  Weight: 63.6 kg (140 lb 3.4 oz)    Examination:  General exam: Appears calm and comfortable  Respiratory system: Wheezing diffusely.  Respiratory effort normal. Cardiovascular system: S1 & S2 heard, RRR. No JVD, murmurs, rubs, gallops or clicks. No pedal edema. Gastrointestinal system: Abdomen is nondistended, soft and nontender. No organomegaly or masses felt. Normal bowel sounds heard.  Central nervous system: Alert and oriented. No focal neurological deficits. Extremities: Symmetric 5 x 5 power. Skin: No rashes, lesions or ulcers Psychiatry: Judgement and insight appear normal. Mood & affect appropriate.   Data Reviewed: I have personally reviewed following labs and imaging studies  CBC:  Recent Labs Lab 12/28/16 2054 12/29/16 0554  WBC 9.4 7.7  NEUTROABS 8.6* 6.5  HGB 13.4 11.6*  HCT 39.2 34.5*  MCV 94.0 95.0  PLT 210 198   Basic Metabolic Panel:  Recent Labs Lab 12/28/16 2054 12/29/16 0554  NA 136 139  K 3.6 3.2*  CL 104 106  CO2 23 25  GLUCOSE 156* 134*  BUN 15 15  CREATININE 0.81 0.74  CALCIUM 8.5* 7.9*  MG 1.8  --    GFR: Estimated Creatinine Clearance: 46.7 mL/min (by C-G formula based on SCr of 0.74 mg/dL). Liver Function Tests:  Recent Labs Lab 12/28/16 2054 12/30/16 1301  AST 44* 40  ALT 26 30  ALKPHOS 69 62  BILITOT 1.5* 0.5  PROT 7.0 6.4*  ALBUMIN 3.6 3.0*    Recent Labs Lab  12/30/16 1301  LIPASE 31   Cardiac Enzymes:  Recent Labs Lab 12/28/16 2054  TROPONINI 0.03*   CBG:  Recent Labs Lab 12/31/16 0759 12/31/16 1205 12/31/16 1711 12/31/16 2155 01/01/17 0727  GLUCAP 185* 177* 213* 192* 194*    Sepsis Labs:  Recent Labs Lab 12/28/16 2110  LATICACIDVEN 1.09    Recent Results (from the past 240 hour(s))  Blood Culture (routine x 2)     Status: None (Preliminary result)   Collection Time: 12/28/16  8:54 PM  Result Value Ref Range Status   Specimen Description RIGHT ANTECUBITAL  Final   Special Requests   Final    BOTTLES DRAWN AEROBIC AND ANAEROBIC Blood Culture adequate volume   Culture NO GROWTH 3 DAYS  Final   Report Status PENDING  Incomplete  Blood Culture (routine x 2)     Status: None (Preliminary result)   Collection Time: 12/28/16  9:06 PM  Result Value Ref Range Status   Specimen Description LEFT ANTECUBITAL  Final   Special Requests   Final    BOTTLES DRAWN AEROBIC ONLY Blood Culture adequate volume   Culture NO GROWTH 3 DAYS  Final   Report Status PENDING  Incomplete     Radiology Studies: No results found.  Scheduled Meds: . albuterol  2.5 mg Nebulization Q6H  . amLODipine  5 mg Oral q morning - 10a  . aspirin  81 mg Oral QPM  . clopidogrel  75 mg Oral q morning - 10a  . gabapentin  100 mg Oral QHS  . insulin aspart  0-9 Units Subcutaneous TID WC  . mouth rinse  15 mL Mouth Rinse BID  . methylPREDNISolone (SOLU-MEDROL) injection  40 mg Intravenous Q6H  . metolazone  5 mg Oral Once per day on Mon Thu  . metoprolol succinate  25 mg Oral q morning - 10a  . sodium chloride flush  3 mL Intravenous Q12H  . traZODone  50 mg Oral QHS   Continuous Infusions: . sodium chloride Stopped (12/31/16 0233)  . azithromycin Stopped (01/01/17 0106)  . cefTRIAXone (ROCEPHIN)  IV Stopped (12/31/16 2351)     LOS: 3 days   Liyla Radliff, MD FACP Hospitalist.   If 7PM-7AM, please contact night-coverage [email or web address] Password  [email or web address] 01/01/2017, 9:45 AM

## 2017-01-02 LAB — CULTURE, BLOOD (ROUTINE X 2)
CULTURE: NO GROWTH
Culture: NO GROWTH
SPECIAL REQUESTS: ADEQUATE
SPECIAL REQUESTS: ADEQUATE

## 2017-01-02 LAB — GLUCOSE, CAPILLARY
GLUCOSE-CAPILLARY: 179 mg/dL — AB (ref 65–99)
Glucose-Capillary: 164 mg/dL — ABNORMAL HIGH (ref 65–99)
Glucose-Capillary: 159 mg/dL — ABNORMAL HIGH (ref 65–99)
Glucose-Capillary: 182 mg/dL — ABNORMAL HIGH (ref 65–99)

## 2017-01-02 NOTE — Progress Notes (Signed)
PROGRESS NOTE    Madison Curry  BMW:[id number] [id number] DOB: Jan 07, 1931 DOA: 12/28/2016 PCP: System, Pcp Not In    Brief Narrative: 83 [place], with hx of COPD, DM, CKD, admitted for CAP, along with stable HTN and COPD exacerbation.  She was having wheezing, and pulmonary was consulted.  Dr Juanetta GoslingHawkins help is well appreciated.  She is getting IV antiobiotics and IV Steroid, along with nebs.     Assessment & Plan:   Principal Problem:   PNA (pneumonia) Active Problems:   Delirium   COPD (chronic obstructive pulmonary disease) (HCC)   Diabetes mellitus without complication (HCC)   Hypertension   CAP (community acquired pneumonia)  1. PNA: Will continue with antibiotics, IV steroid and neb. She is improving, but very slowly.  Suspect she will need home oxygne.  2. COPD: As above. 3. DM: Adjusting SSI, and BSs have been undercontrolled.  4. HTN: BP is controlled. So will continue with meds.  DVT prophylaxis: SCD Code Status: FULL Family Communication: None.  Disposition Plan: Home.   Consultants:   Pulmonary.   Antimicrobials: Anti-infectives    Start     Dose/Rate Route Frequency Ordered Stop   12/29/16 2330  azithromycin (ZITHROMAX) 500 mg in dextrose 5 % 250 mL IVPB     500 mg 250 mL/hr over 60 Minutes Intravenous Every 24 hours 12/29/16 0028 01/05/17 2329   12/29/16 2300  cefTRIAXone (ROCEPHIN) 1 g in dextrose 5 % 50 mL IVPB     1 g 100 mL/hr over 30 Minutes Intravenous Every 24 hours 12/29/16 0028 01/05/17 2259   12/28/16 2315  azithromycin (ZITHROMAX) 500 mg in dextrose 5 % 250 mL IVPB     500 mg 250 mL/hr over 60 Minutes Intravenous  Once 12/28/16 2306 12/29/16 0040   12/28/16 2230  cefTRIAXone (ROCEPHIN) 1 g in dextrose 5 % 50 mL IVPB     1 g 100 mL/hr over 30 Minutes Intravenous  Once 12/28/16 2216 12/28/16 2340       Subjective:  Doing better, but still SOB.   Objective: Vitals:   01/02/17 0647 01/02/17 0734 01/02/17 0740  01/02/17 0742  BP: (!) 130/52     Pulse: (!) 59     Resp: 18     Temp: 98.2 F (36.8 C)     TempSrc: Oral     SpO2: 93% 94% 94% 94%  Weight:      Height:        Intake/Output Summary (Last 24 hours) at 01/02/17 0941 Last data filed at 01/01/17 2213  Gross per 24 hour  Intake              480 ml  Output              900 ml  Net             -420 ml   Filed Weights   12/29/16 0100  Weight: 63.6 kg (140 lb 3.4 oz)    Examination:  General exam: Appears calm and comfortable  Respiratory system: Still has significant wheezing.  Cardiovascular system: S1 & S2 heard, RRR. No JVD, murmurs, rubs, gallops or clicks. No pedal edema. Gastrointestinal system: Abdomen is nondistended, soft and nontender. No organomegaly or masses felt. Normal bowel sounds heard. Central nervous system: Alert and oriented. No focal neurological deficits. Extremities: Symmetric 5 x 5 power. Skin: No rashes, lesions or ulcers Psychiatry: Judgement and insight appear normal. Mood & affect appropriate.   Data  Reviewed: I have personally reviewed following labs and imaging studies  CBC:  Recent Labs Lab 12/28/16 2054 12/29/16 0554  WBC 9.4 7.7  NEUTROABS 8.6* 6.5  HGB 13.4 11.6*  HCT 39.2 34.5*  MCV 94.0 95.0  PLT 210 198   Basic Metabolic Panel:  Recent Labs Lab 12/28/16 2054 12/29/16 0554  NA 136 139  K 3.6 3.2*  CL 104 106  CO2 23 25  GLUCOSE 156* 134*  BUN 15 15  CREATININE 0.81 0.74  CALCIUM 8.5* 7.9*  MG 1.8  --    GFR: Estimated Creatinine Clearance: 46.7 mL/min (by C-G formula based on SCr of 0.74 mg/dL). Liver Function Tests:  Recent Labs Lab 12/28/16 2054 12/30/16 1301  AST 44* 40  ALT 26 30  ALKPHOS 69 62  BILITOT 1.5* 0.5  PROT 7.0 6.4*  ALBUMIN 3.6 3.0*    Recent Labs Lab 12/30/16 1301  LIPASE 31  Cardiac Enzymes:  Recent Labs Lab 12/28/16 2054  TROPONINI 0.03*   CBG:  Recent Labs Lab 01/01/17 0727 01/01/17 1111 01/01/17 1616 01/01/17 2104  01/02/17 0724  GLUCAP 194* 205* 138* 219* 164*   Lipid Profile:  Recent Labs Lab 12/28/16 2110  LATICACIDVEN 1.09    Recent Results (from the past 240 hour(s))  Blood Culture (routine x 2)     Status: None (Preliminary result)   Collection Time: 12/28/16  8:54 PM  Result Value Ref Range Status   Specimen Description RIGHT ANTECUBITAL  Final   Special Requests   Final    BOTTLES DRAWN AEROBIC AND ANAEROBIC Blood Culture adequate volume   Culture NO GROWTH 4 DAYS  Final   Report Status PENDING  Incomplete  Blood Culture (routine x 2)     Status: None (Preliminary result)   Collection Time: 12/28/16  9:06 PM  Result Value Ref Range Status   Specimen Description LEFT ANTECUBITAL  Final   Special Requests   Final    BOTTLES DRAWN AEROBIC ONLY Blood Culture adequate volume   Culture NO GROWTH 4 DAYS  Final   Report Status PENDING  Incomplete     Radiology Studies: No results found.  Scheduled Meds: . albuterol  2.5 mg Nebulization Q6H  . amLODipine  5 mg Oral q morning - 10a  . aspirin  81 mg Oral QPM  . clopidogrel  75 mg Oral q morning - 10a  . fluticasone furoate-vilanterol  1 puff Inhalation Daily  . gabapentin  100 mg Oral QHS  . guaiFENesin  1,200 mg Oral BID  . insulin aspart  0-9 Units Subcutaneous TID WC  . mouth rinse  15 mL Mouth Rinse BID  . methylPREDNISolone (SOLU-MEDROL) injection  40 mg Intravenous Q6H  . metolazone  5 mg Oral Once per day on Mon Thu  . metoprolol succinate  25 mg Oral q morning - 10a  . sodium chloride flush  3 mL Intravenous Q12H  . traZODone  50 mg Oral QHS  . umeclidinium bromide  1 puff Inhalation Daily   Continuous Infusions: . sodium chloride Stopped (12/31/16 0233)  . azithromycin Stopped (01/02/17 0000)  . cefTRIAXone (ROCEPHIN)  IV Stopped (01/01/17 2230)     LOS: 4 days   Arletha Marschke, MD FACP Hospitalist.   If 7PM-7AM, please contact night-coverage [email or web address] Password [facility] 01/02/2017, 9:41 AM

## 2017-01-02 NOTE — Progress Notes (Signed)
Subjective: She says she still coughing. She does bring up a little bit of sputum. No other new complaints. No chest pain nausea vomiting or diarrhea  Objective: Vital signs in last 24 hours: Temp:  [97.6 F (36.4 C)-98.2 F (36.8 C)] 98.2 F (36.8 C) (05/13 0647) Pulse Rate:  [59-68] 59 (05/13 0647) Resp:  [18] 18 (05/13 0647) BP: (128-130)/(46-53) 130/52 (05/13 0647) SpO2:  [88 %-95 %] 94 % (05/13 0742) Weight change:  Last BM Date: 12/30/16  Intake/Output from previous day: 05/12 0701 - 05/13 0700 In: 723 [P.O.:720; I.V.:3] Out: 1300 [Urine:1300]  PHYSICAL EXAM Constitutional: She is awake and alert and in no acute distress. She is hard of hearing. Ears nose mouth and throat: Her mucous membranes are moist. Her throat is clear. Cardiovascular: Her heart is regular with normal heart sounds. Respiratory: Her respiratory effort is normal and she has bilateral rhonchi and end-expiratory wheezes. She seems to be moving air a little better than yesterday. Gastrointestinal: Her abdomen is soft neurological: Other than her difficulty hearing she has no focal neurological findings  Lab Results:  Results for orders placed or performed during the hospital encounter of 12/28/16 (from the past 48 hour(s))  Glucose, capillary     Status: Abnormal   Collection Time: 12/31/16 12:05 PM  Result Value Ref Range   Glucose-Capillary 177 (H) 65 - 99 mg/dL  Glucose, capillary     Status: Abnormal   Collection Time: 12/31/16  5:11 PM  Result Value Ref Range   Glucose-Capillary 213 (H) 65 - 99 mg/dL  Glucose, capillary     Status: Abnormal   Collection Time: 12/31/16  9:55 PM  Result Value Ref Range   Glucose-Capillary 192 (H) 65 - 99 mg/dL   Comment 1 Notify RN    Comment 2 Document in Chart   Glucose, capillary     Status: Abnormal   Collection Time: 01/01/17  7:27 AM  Result Value Ref Range   Glucose-Capillary 194 (H) 65 - 99 mg/dL  Glucose, capillary     Status: Abnormal   Collection Time:  01/01/17 11:11 AM  Result Value Ref Range   Glucose-Capillary 205 (H) 65 - 99 mg/dL  Glucose, capillary     Status: Abnormal   Collection Time: 01/01/17  4:16 PM  Result Value Ref Range   Glucose-Capillary 138 (H) 65 - 99 mg/dL  Glucose, capillary     Status: Abnormal   Collection Time: 01/01/17  9:04 PM  Result Value Ref Range   Glucose-Capillary 219 (H) 65 - 99 mg/dL   Comment 1 Notify RN    Comment 2 Document in Chart   Glucose, capillary     Status: Abnormal   Collection Time: 01/02/17  7:24 AM  Result Value Ref Range   Glucose-Capillary 164 (H) 65 - 99 mg/dL   Comment 1 Notify RN    Comment 2 Document in Chart     ABGS No results for input(s): PHART, PO2ART, TCO2, HCO3 in the last 72 hours.  Invalid input(s): PCO2 CULTURES Recent Results (from the past 240 hour(s))  Blood Culture (routine x 2)     Status: None (Preliminary result)   Collection Time: 12/28/16  8:54 PM  Result Value Ref Range Status   Specimen Description RIGHT ANTECUBITAL  Final   Special Requests   Final    BOTTLES DRAWN AEROBIC AND ANAEROBIC Blood Culture adequate volume   Culture NO GROWTH 4 DAYS  Final   Report Status PENDING  Incomplete  Blood Culture (  routine x 2)     Status: None (Preliminary result)   Collection Time: 12/28/16  9:06 PM  Result Value Ref Range Status   Specimen Description LEFT ANTECUBITAL  Final   Special Requests   Final    BOTTLES DRAWN AEROBIC ONLY Blood Culture adequate volume   Culture NO GROWTH 4 DAYS  Final   Report Status PENDING  Incomplete   Studies/Results: No results found.  Medications:  Prior to Admission:  Prescriptions Prior to Admission  Medication Sig Dispense Refill Last Dose  . albuterol (PROVENTIL) (2.5 MG/3ML) 0.083% nebulizer solution Take 2.5 mg by nebulization every 6 (six) hours as needed for wheezing or shortness of breath.   unknown  . amLODipine (NORVASC) 5 MG tablet Take 5 mg by mouth every morning.   12/28/2016 at Unknown time  . aspirin  (GNP ADULT ASPIRIN LOW STRENGTH) 81 MG chewable tablet Chew 81 mg by mouth every evening.   12/27/2016 at Unknown time  . azelastine (ASTELIN) 0.1 % nasal spray Place 2 sprays into both nostrils at bedtime as needed for rhinitis. Use in each nostril as directed   unknown  . BIOTIN PO Take 1 capsule by mouth every evening.   12/27/2016 at Unknown time  . calcium carbonate (CALCIUM 600) 1500 (600 Ca) MG TABS tablet Take 1 tablet by mouth 2 (two) times daily with a meal.   12/28/2016 at Unknown time  . cetirizine (ZYRTEC) 10 MG tablet Take 10 mg by mouth every morning.   12/28/2016 at Unknown time  . chlorpheniramine (CHLOR-TRIMETON) 4 MG tablet Take 4 mg by mouth at bedtime.   12/27/2016 at Unknown time  . Chlorpheniramine-DM (CORICIDIN COUGH/COLD) 4-30 MG TABS Take 1-2 tablets by mouth daily as needed (coldand cough).   12/28/2016 at Unknown time  . clopidogrel (PLAVIX) 75 MG tablet Take 75 mg by mouth every morning.   12/28/2016 at Unknown time  . dextromethorphan-guaiFENesin (DIABETIC TUSSIN DM) 10-100 MG/5ML liquid Take 5 mLs by mouth every 4 (four) hours as needed for cough.   Past Week at Unknown time  . donepezil (ARICEPT) 5 MG tablet Take 5 mg by mouth every evening.   12/27/2016 at Unknown time  . Fluticasone-Salmeterol (ADVAIR) 250-50 MCG/DOSE AEPB Inhale 1 puff into the lungs every morning. *May use once additional as needed   12/28/2016 at Unknown time  . folic acid (FOLVITE) 800 MCG tablet Take 400 mcg by mouth every evening.   12/27/2016 at Unknown time  . gabapentin (NEURONTIN) 100 MG capsule Take 100 mg by mouth at bedtime.   12/27/2016 at Unknown time  . magnesium oxide (MAG-OX) 400 MG tablet Take 400 mg by mouth every morning.   12/28/2016 at Unknown time  . Melatonin 3 MG TABS Take 3 mg by mouth at bedtime.   12/27/2016 at Unknown time  . metFORMIN (GLUCOPHAGE) 500 MG tablet Take 250 mg by mouth daily as needed (for blood sugar levels).   unknown  . methenamine (HIPREX) 1 g tablet Take 1 g by mouth every  morning.   12/28/2016 at Unknown time  . methotrexate (RHEUMATREX) 2.5 MG tablet Take 5 mg by mouth every Friday. Caution:Chemotherapy. Protect from light. EVENINGS ONLY   12/24/2016 at Unknown time  . metolazone (ZAROXOLYN) 5 MG tablet Take 5 mg by mouth 2 (two) times a week. Mondays and Thursdays   12/27/2016 at Unknown time  . metoprolol succinate (TOPROL-XL) 25 MG 24 hr tablet Take 25 mg by mouth every morning.   12/28/2016 at Unknown  time  . Multiple Vitamin (MULTIVITAMIN WITH MINERALS) TABS tablet Take 1 tablet by mouth every morning.   12/28/2016 at Unknown time  . Omega-3 Fatty Acids (FISH OIL) 1200 MG CAPS Take 1 capsule by mouth every evening.   12/27/2016 at Unknown time  . potassium chloride (K-DUR,KLOR-CON) 10 MEQ tablet Take 10 mEq by mouth 2 (two) times daily.   12/28/2016 at Unknown time  . ranitidine (ZANTAC) 150 MG tablet Take 150 mg by mouth 2 (two) times daily.   12/28/2016 at Unknown time  . Red Yeast Rice 600 MG CAPS Take 2 capsules by mouth every evening.   12/27/2016 at Unknown time  . traZODone (DESYREL) 50 MG tablet Take 50 mg by mouth at bedtime.   12/27/2016 at Unknown time  . triamcinolone (NASACORT ALLERGY 24HR) 55 MCG/ACT AERO nasal inhaler Place 2 sprays into the nose daily as needed (for allergies).   unknown   Scheduled: . albuterol  2.5 mg Nebulization Q6H  . amLODipine  5 mg Oral q morning - 10a  . aspirin  81 mg Oral QPM  . clopidogrel  75 mg Oral q morning - 10a  . fluticasone furoate-vilanterol  1 puff Inhalation Daily  . gabapentin  100 mg Oral QHS  . guaiFENesin  1,200 mg Oral BID  . insulin aspart  0-9 Units Subcutaneous TID WC  . mouth rinse  15 mL Mouth Rinse BID  . methylPREDNISolone (SOLU-MEDROL) injection  40 mg Intravenous Q6H  . metolazone  5 mg Oral Once per day on Mon Thu  . metoprolol succinate  25 mg Oral q morning - 10a  . sodium chloride flush  3 mL Intravenous Q12H  . traZODone  50 mg Oral QHS  . umeclidinium bromide  1 puff Inhalation Daily    Continuous: . sodium chloride Stopped (12/31/16 0233)  . azithromycin Stopped (01/02/17 0000)  . cefTRIAXone (ROCEPHIN)  IV Stopped (01/01/17 2230)   RUE:AVWUJW chloride, acetaminophen, azelastine, HYDROcodone-homatropine, ondansetron (ZOFRAN) IV, sodium chloride flush  Assesment: She was admitted with community-acquired pneumonia and COPD exacerbation. She is improving but very slowly. Principal Problem:   PNA (pneumonia) Active Problems:   Delirium   COPD (chronic obstructive pulmonary disease) (HCC)   Diabetes mellitus without complication (HCC)   Hypertension   CAP (community acquired pneumonia)    Plan: Continue antibiotics and steroids inhaled bronchodilators    LOS: 4 days   Jorgen Wolfinger L 01/02/2017, 9:10 AM

## 2017-01-03 LAB — GLUCOSE, CAPILLARY
GLUCOSE-CAPILLARY: 191 mg/dL — AB (ref 65–99)
GLUCOSE-CAPILLARY: 186 mg/dL — AB (ref 65–99)
GLUCOSE-CAPILLARY: 225 mg/dL — AB (ref 65–99)
Glucose-Capillary: 214 mg/dL — ABNORMAL HIGH (ref 65–99)

## 2017-01-03 MED ORDER — TERBUTALINE SULFATE 5 MG PO TABS
2.5000 mg | ORAL_TABLET | Freq: Four times a day (QID) | ORAL | Status: DC
  Administered 2017-01-03 – 2017-01-04 (×5): 2.5 mg via ORAL
  Filled 2017-01-03 (×9): qty 1

## 2017-01-03 MED ORDER — FUROSEMIDE 10 MG/ML IJ SOLN
20.0000 mg | Freq: Once | INTRAMUSCULAR | Status: AC
  Administered 2017-01-03: 20 mg via INTRAVENOUS
  Filled 2017-01-03: qty 2

## 2017-01-03 MED ORDER — POTASSIUM CHLORIDE CRYS ER 20 MEQ PO TBCR
20.0000 meq | EXTENDED_RELEASE_TABLET | Freq: Every day | ORAL | Status: DC
  Administered 2017-01-03 – 2017-01-04 (×2): 20 meq via ORAL
  Filled 2017-01-03 (×2): qty 1

## 2017-01-03 NOTE — Progress Notes (Signed)
Patient walked on room air without dropping below 97 for 25 feet. Patient then refused to walk any further.

## 2017-01-03 NOTE — Progress Notes (Signed)
Subjective: She says she's not sure if she is any better. She seems a little confused this morning. No other new complaints. No pain. Her cough is mostly nonproductive.  Objective: Vital signs in last 24 hours: Temp:  [97.7 F (36.5 C)-98.6 F (37 C)] 98.2 F (36.8 C) (05/14 0557) Pulse Rate:  [55-68] 55 (05/14 0557) Resp:  [20] 20 (05/14 0557) BP: (136-150)/(48-54) 136/48 (05/14 0557) SpO2:  [1 %-96 %] 96 % (05/14 0729) Weight change:  Last BM Date: 12/30/16  Intake/Output from previous day: 05/13 0701 - 05/14 0700 In: 1270 [P.O.:720; IV Piggyback:550] Out: 1100 [Urine:1100]  PHYSICAL EXAM General appearance: alert, mild distress and Mildly confused Resp: rhonchi bilaterally Cardio: regular rate and rhythm, S1, S2 normal, no murmur, click, rub or gallop GI: soft, non-tender; bowel sounds normal; no masses,  no organomegaly Extremities: extremities normal, atraumatic, no cyanosis or edema Skin warm and dry  Lab Results:  Results for orders placed or performed during the hospital encounter of 12/28/16 (from the past 48 hour(s))  Glucose, capillary     Status: Abnormal   Collection Time: 01/01/17 11:11 AM  Result Value Ref Range   Glucose-Capillary 205 (H) 65 - 99 mg/dL  Glucose, capillary     Status: Abnormal   Collection Time: 01/01/17  4:16 PM  Result Value Ref Range   Glucose-Capillary 138 (H) 65 - 99 mg/dL  Glucose, capillary     Status: Abnormal   Collection Time: 01/01/17  9:04 PM  Result Value Ref Range   Glucose-Capillary 219 (H) 65 - 99 mg/dL   Comment 1 Notify RN    Comment 2 Document in Chart   Glucose, capillary     Status: Abnormal   Collection Time: 01/02/17  7:24 AM  Result Value Ref Range   Glucose-Capillary 164 (H) 65 - 99 mg/dL   Comment 1 Notify RN    Comment 2 Document in Chart   Glucose, capillary     Status: Abnormal   Collection Time: 01/02/17 11:33 AM  Result Value Ref Range   Glucose-Capillary 159 (H) 65 - 99 mg/dL  Glucose, capillary      Status: Abnormal   Collection Time: 01/02/17  4:04 PM  Result Value Ref Range   Glucose-Capillary 182 (H) 65 - 99 mg/dL   Comment 1 Notify RN    Comment 2 Document in Chart   Glucose, capillary     Status: Abnormal   Collection Time: 01/02/17  9:04 PM  Result Value Ref Range   Glucose-Capillary 179 (H) 65 - 99 mg/dL   Comment 1 Notify RN    Comment 2 Document in Chart   Glucose, capillary     Status: Abnormal   Collection Time: 01/03/17  8:16 AM  Result Value Ref Range   Glucose-Capillary 191 (H) 65 - 99 mg/dL   Comment 1 Notify RN    Comment 2 Document in Chart     ABGS No results for input(s): PHART, PO2ART, TCO2, HCO3 in the last 72 hours.  Invalid input(s): PCO2 CULTURES Recent Results (from the past 240 hour(s))  Blood Culture (routine x 2)     Status: None   Collection Time: 12/28/16  8:54 PM  Result Value Ref Range Status   Specimen Description RIGHT ANTECUBITAL  Final   Special Requests   Final    BOTTLES DRAWN AEROBIC AND ANAEROBIC Blood Culture adequate volume   Culture   Final    NO GROWTH 5 DAYS Performed at [facility], [address] [place], [place] [address]    Report Status 01/02/2017 FINAL  Final  Blood Culture (routine x 2)     Status: None   Collection Time: 12/28/16  9:06 PM  Result Value Ref Range Status   Specimen Description LEFT ANTECUBITAL  Final   Special Requests   Final    BOTTLES DRAWN AEROBIC ONLY Blood Culture adequate volume   Culture   Final    NO GROWTH 5 DAYS Performed at [facility], [address], [place], [place] [address]    Report Status 01/02/2017 FINAL  Final   Studies/Results: No results found.  Medications:  Prior to Admission:  Prescriptions Prior to Admission  Medication Sig Dispense Refill Last Dose  . albuterol (PROVENTIL) (2.5 MG/3ML) 0.083% nebulizer solution Take 2.5 mg by nebulization every 6 (six) hours as needed for wheezing or shortness of breath.   unknown  . amLODipine (NORVASC) 5 MG  tablet Take 5 mg by mouth every morning.   12/28/2016 at Unknown time  . aspirin (GNP ADULT ASPIRIN LOW STRENGTH) 81 MG chewable tablet Chew 81 mg by mouth every evening.   12/27/2016 at Unknown time  . azelastine (ASTELIN) 0.1 % nasal spray Place 2 sprays into both nostrils at bedtime as needed for rhinitis. Use in each nostril as directed   unknown  . BIOTIN PO Take 1 capsule by mouth every evening.   12/27/2016 at Unknown time  . calcium carbonate (CALCIUM 600) 1500 (600 Ca) MG TABS tablet Take 1 tablet by mouth 2 (two) times daily with a meal.   12/28/2016 at Unknown time  . cetirizine (ZYRTEC) 10 MG tablet Take 10 mg by mouth every morning.   12/28/2016 at Unknown time  . chlorpheniramine (CHLOR-TRIMETON) 4 MG tablet Take 4 mg by mouth at bedtime.   12/27/2016 at Unknown time  . Chlorpheniramine-DM (CORICIDIN COUGH/COLD) 4-30 MG TABS Take 1-2 tablets by mouth daily as needed (coldand cough).   12/28/2016 at Unknown time  . clopidogrel (PLAVIX) 75 MG tablet Take 75 mg by mouth every morning.   12/28/2016 at Unknown time  . dextromethorphan-guaiFENesin (DIABETIC TUSSIN DM) 10-100 MG/5ML liquid Take 5 mLs by mouth every 4 (four) hours as needed for cough.   Past Week at Unknown time  . donepezil (ARICEPT) 5 MG tablet Take 5 mg by mouth every evening.   12/27/2016 at Unknown time  . Fluticasone-Salmeterol (ADVAIR) 250-50 MCG/DOSE AEPB Inhale 1 puff into the lungs every morning. *May use once additional as needed   12/28/2016 at Unknown time  . folic acid (FOLVITE) 800 MCG tablet Take 400 mcg by mouth every evening.   12/27/2016 at Unknown time  . gabapentin (NEURONTIN) 100 MG capsule Take 100 mg by mouth at bedtime.   12/27/2016 at Unknown time  . magnesium oxide (MAG-OX) 400 MG tablet Take 400 mg by mouth every morning.   12/28/2016 at Unknown time  . Melatonin 3 MG TABS Take 3 mg by mouth at bedtime.   12/27/2016 at Unknown time  . metFORMIN (GLUCOPHAGE) 500 MG tablet Take 250 mg by mouth daily as needed (for blood sugar  levels).   unknown  . methenamine (HIPREX) 1 g tablet Take 1 g by mouth every morning.   12/28/2016 at Unknown time  . methotrexate (RHEUMATREX) 2.5 MG tablet Take 5 mg by mouth every Friday. Caution:Chemotherapy. Protect from light. EVENINGS ONLY   12/24/2016 at Unknown time  . metolazone (ZAROXOLYN) 5 MG tablet Take 5 mg by mouth 2 (  two) times a week. Mondays and Thursdays   12/27/2016 at Unknown time  . metoprolol succinate (TOPROL-XL) 25 MG 24 hr tablet Take 25 mg by mouth every morning.   12/28/2016 at Unknown time  . Multiple Vitamin (MULTIVITAMIN WITH MINERALS) TABS tablet Take 1 tablet by mouth every morning.   12/28/2016 at Unknown time  . Omega-3 Fatty Acids (FISH OIL) 1200 MG CAPS Take 1 capsule by mouth every evening.   12/27/2016 at Unknown time  . potassium chloride (K-DUR,KLOR-CON) 10 MEQ tablet Take 10 mEq by mouth 2 (two) times daily.   12/28/2016 at Unknown time  . ranitidine (ZANTAC) 150 MG tablet Take 150 mg by mouth 2 (two) times daily.   12/28/2016 at Unknown time  . Red Yeast Rice 600 MG CAPS Take 2 capsules by mouth every evening.   12/27/2016 at Unknown time  . traZODone (DESYREL) 50 MG tablet Take 50 mg by mouth at bedtime.   12/27/2016 at Unknown time  . triamcinolone (NASACORT ALLERGY 24HR) 55 MCG/ACT AERO nasal inhaler Place 2 sprays into the nose daily as needed (for allergies).   unknown   Scheduled: . albuterol  2.5 mg Nebulization Q6H  . amLODipine  5 mg Oral q morning - 10a  . aspirin  81 mg Oral QPM  . clopidogrel  75 mg Oral q morning - 10a  . fluticasone furoate-vilanterol  1 puff Inhalation Daily  . gabapentin  100 mg Oral QHS  . guaiFENesin  1,200 mg Oral BID  . insulin aspart  0-9 Units Subcutaneous TID WC  . mouth rinse  15 mL Mouth Rinse BID  . methylPREDNISolone (SOLU-MEDROL) injection  40 mg Intravenous Q6H  . metolazone  5 mg Oral Once per day on Mon Thu  . metoprolol succinate  25 mg Oral q morning - 10a  . sodium chloride flush  3 mL Intravenous Q12H  . traZODone   50 mg Oral QHS  . umeclidinium bromide  1 puff Inhalation Daily   Continuous: . sodium chloride Stopped (12/31/16 0233)  . azithromycin 500 mg (01/02/17 2230)  . cefTRIAXone (ROCEPHIN)  IV Stopped (01/02/17 2230)   ZOX:WRUEAVPRN:sodium chloride, acetaminophen, azelastine, HYDROcodone-homatropine, ondansetron (ZOFRAN) IV, sodium chloride flush  Assesment: She was admitted with pneumonia and COPD exacerbation. She is improving but slowly. She had delirium on admission and seems a little confused this morning. No other new complaints. Principal Problem:   PNA (pneumonia) Active Problems:   Delirium   COPD (chronic obstructive pulmonary disease) (HCC)   Diabetes mellitus without complication (HCC)   Hypertension   CAP (community acquired pneumonia)    Plan: She says she hasn't been out of bed since she's been in the hospital except go to the bathroom so that may help her if she can get up. Otherwise continue treatments.    LOS: 5 days   Kohana Amble L 01/03/2017, 8:29 AM

## 2017-01-03 NOTE — Progress Notes (Signed)
PROGRESS NOTE    [place]  ZOX:[id number] DOB: [birth year] DOA: 12/28/2016 PCP: System, Pcp Not In    Brief Narrative:  83 yo lives in [place], visiting her daughter, with hx of COPD, DM, CKD, admitted for CAP, along with stable HTN and COPD exacerbation.  She was having wheezing, and pulmonary was consulted.  Dr Juanetta GoslingHawkins help is well appreciated.  She is getting IV antiobiotics and IV Steroid, along with nebs.  Her exam is a little better today, and I have updated her daughter today.  She said her mother always has SOB and low exercise tolerance.    Assessment & Plan:   Principal Problem:   PNA (pneumonia) Active Problems:   Delirium   COPD (chronic obstructive pulmonary disease) (HCC)   Diabetes mellitus without complication (HCC)   Hypertension   CAP (community acquired pneumonia)   1. CAP:  Will continue with Tx.  2. COPD exacerbation:  I will add Terbutaline.  Continue with current Tx.   Pulmonary following. Will obtain ECHO.  Give Lasix 20 IV x 1. 3. DM:  Adjusting SSI.  BS controlled.  4. HTN:  Controlled.  Continue meds.   DVT prophylaxis: SCD.  Code Status: FULL CODE.  Family Communication: daughter.  Disposition Plan: Home.   Consultants:   Pulmonary   Procedures:   None.   Antimicrobials: Anti-infectives    Start     Dose/Rate Route Frequency Ordered Stop   12/29/16 2330  azithromycin (ZITHROMAX) 500 mg in dextrose 5 % 250 mL IVPB     500 mg 250 mL/hr over 60 Minutes Intravenous Every 24 hours 12/29/16 0028 01/05/17 2329   12/29/16 2300  cefTRIAXone (ROCEPHIN) 1 g in dextrose 5 % 50 mL IVPB     1 g 100 mL/hr over 30 Minutes Intravenous Every 24 hours 12/29/16 0028 01/05/17 2259   12/28/16 2315  azithromycin (ZITHROMAX) 500 mg in dextrose 5 % 250 mL IVPB     500 mg 250 mL/hr over 60 Minutes Intravenous  Once 12/28/16 2306 12/29/16 0040   12/28/16 2230  cefTRIAXone (ROCEPHIN) 1 g in dextrose 5 % 50 mL IVPB     1 g 100 mL/hr over 30 Minutes  Intravenous  Once 12/28/16 2216 12/28/16 2340       Subjective:   Feeling about the same.   Objective: Vitals:   01/03/17 0557 01/03/17 0724 01/03/17 0728 01/03/17 0729  BP: (!) 136/48     Pulse: (!) 55     Resp: 20     Temp: 98.2 F (36.8 C)     TempSrc: Oral     SpO2: 95% 96% 96% 96%  Weight:      Height:        Intake/Output Summary (Last 24 hours) at 01/03/17 0917 Last data filed at 01/03/17 0851  Gross per 24 hour  Intake             1270 ml  Output             1100 ml  Net              170 ml   Filed Weights   12/29/16 0100  Weight: 63.6 kg (140 lb 3.4 oz)    Examination:  General exam: Appears calm and comfortable  Respiratory system: Clear to auscultation. Respiratory effort normal. Cardiovascular system: S1 & S2 heard, RRR. No JVD, murmurs, rubs, gallops or clicks. No pedal edema. Gastrointestinal system: Abdomen is nondistended, soft and nontender. No organomegaly or  masses felt. Normal bowel sounds heard. Central nervous system: Alert and oriented. No focal neurological deficits. Extremities: Symmetric 5 x 5 power. Skin: No rashes, lesions or ulcers Psychiatry: Judgement and insight appear normal. Mood & affect appropriate.   Data Reviewed: I have personally reviewed following labs and imaging studies  CBC:  Recent Labs Lab 12/28/16 2054 12/29/16 0554  WBC 9.4 7.7  NEUTROABS 8.6* 6.5  HGB 13.4 11.6*  HCT 39.2 34.5*  MCV 94.0 95.0  PLT 210 198   Basic Metabolic Panel:  Recent Labs Lab 12/28/16 2054 12/29/16 0554  NA 136 139  K 3.6 3.2*  CL 104 106  CO2 23 25  GLUCOSE 156* 134*  BUN 15 15  CREATININE 0.81 0.74  CALCIUM 8.5* 7.9*  MG 1.8  --    GFR: Estimated Creatinine Clearance: 46.7 mL/min (by C-G formula based on SCr of 0.74 mg/dL). Liver Function Tests:  Recent Labs Lab 12/28/16 2054 12/30/16 1301  AST 44* 40  ALT 26 30  ALKPHOS 69 62  BILITOT 1.5* 0.5  PROT 7.0 6.4*  ALBUMIN 3.6 3.0*    Recent Labs Lab  12/30/16 1301  LIPASE 31   Cardiac Enzymes:  Recent Labs Lab 12/28/16 2054  TROPONINI 0.03*   BNP (last 3 results) CBG:  Recent Labs Lab 01/02/17 0724 01/02/17 1133 01/02/17 1604 01/02/17 2104 01/03/17 0816  GLUCAP 164* 159* 182* 179* 191*   Sepsis Labs:  Recent Labs Lab 12/28/16 2110  LATICACIDVEN 1.09    Recent Results (from the past 240 hour(s))  Blood Culture (routine x 2)     Status: None   Collection Time: 12/28/16  8:54 PM  Result Value Ref Range Status   Specimen Description RIGHT ANTECUBITAL  Final   Special Requests   Final    BOTTLES DRAWN AEROBIC AND ANAEROBIC Blood Culture adequate volume   Culture   Final    NO GROWTH 5 DAYS Performed at [facility] Lab, [address] [place], [place] [address]    Report Status 01/02/2017 FINAL  Final  Blood Culture (routine x 2)     Status: None   Collection Time: 12/28/16  9:06 PM  Result Value Ref Range Status   Specimen Description LEFT ANTECUBITAL  Final   Special Requests   Final    BOTTLES DRAWN AEROBIC ONLY Blood Culture adequate volume   Culture   Final    NO GROWTH 5 DAYS Performed at [facility], [address] [place], [place] [address]    Report Status 01/02/2017 FINAL  Final     Radiology Studies: No results found.  Scheduled Meds: . albuterol  2.5 mg Nebulization Q6H  . amLODipine  5 mg Oral q morning - 10a  . aspirin  81 mg Oral QPM  . clopidogrel  75 mg Oral q morning - 10a  . fluticasone furoate-vilanterol  1 puff Inhalation Daily  . gabapentin  100 mg Oral QHS  . guaiFENesin  1,200 mg Oral BID  . insulin aspart  0-9 Units Subcutaneous TID WC  . mouth rinse  15 mL Mouth Rinse BID  . methylPREDNISolone (SOLU-MEDROL) injection  40 mg Intravenous Q6H  . metolazone  5 mg Oral Once per day on Mon Thu  . metoprolol succinate  25 mg Oral q morning - 10a  . sodium chloride flush  3 mL Intravenous Q12H  . traZODone  50 mg Oral QHS  . umeclidinium bromide  1 puff Inhalation  Daily   Continuous Infusions: .  sodium chloride Stopped (12/31/16 0233)  . azithromycin 500 mg (01/02/17 2230)  . cefTRIAXone (ROCEPHIN)  IV Stopped (01/02/17 2230)     LOS: 5 days   Kasey Ewings, MD FACP Hospitalist.   If 7PM-7AM, please contact night-coverage [email or web address] Password [facility] 01/03/2017, 9:17 AM

## 2017-01-04 ENCOUNTER — Inpatient Hospital Stay (HOSPITAL_COMMUNITY): Payer: Medicare Other

## 2017-01-04 DIAGNOSIS — R0600 Dyspnea, unspecified: Secondary | ICD-10-CM

## 2017-01-04 LAB — C DIFFICILE QUICK SCREEN W PCR REFLEX
C DIFFICLE (CDIFF) ANTIGEN: POSITIVE — AB
C Diff toxin: NEGATIVE

## 2017-01-04 LAB — GLUCOSE, CAPILLARY
GLUCOSE-CAPILLARY: 202 mg/dL — AB (ref 65–99)
GLUCOSE-CAPILLARY: 175 mg/dL — AB (ref 65–99)
Glucose-Capillary: 226 mg/dL — ABNORMAL HIGH (ref 65–99)

## 2017-01-04 LAB — CLOSTRIDIUM DIFFICILE BY PCR: CDIFFPCR: NEGATIVE

## 2017-01-04 LAB — ECHOCARDIOGRAM COMPLETE
Height: 62 in
Weight: 2243.4 oz

## 2017-01-04 MED ORDER — UMECLIDINIUM BROMIDE 62.5 MCG/INH IN AEPB: 1.0000 | INHALATION_SPRAY | Freq: Every day | RESPIRATORY_TRACT | 3 refills | Status: AC

## 2017-01-04 MED ORDER — ACETAMINOPHEN 325 MG PO TABS: 650.0000 mg | ORAL_TABLET | Freq: Four times a day (QID) | ORAL | 1 refills | Status: AC | PRN

## 2017-01-04 MED ORDER — TERBUTALINE SULFATE 5 MG PO TABS: 2.5000 mg | ORAL_TABLET | Freq: Four times a day (QID) | ORAL | 1 refills | Status: AC

## 2017-01-04 MED ORDER — FLUTICASONE FUROATE-VILANTEROL 100-25 MCG/INH IN AEPB: 1.0000 | INHALATION_SPRAY | Freq: Every day | RESPIRATORY_TRACT | 3 refills | Status: AC

## 2017-01-04 MED ORDER — PREDNISONE 10 MG (21) PO TBPK: ORAL_TABLET | ORAL | 0 refills | Status: AC

## 2017-01-04 MED ORDER — LEVOFLOXACIN 500 MG PO TABS: 500.0000 mg | ORAL_TABLET | Freq: Every day | ORAL | 0 refills | Status: AC

## 2017-01-04 NOTE — Progress Notes (Signed)
*  PRELIMINARY RESULTS* Echocardiogram 2D Echocardiogram has been performed.  [name], [name] 01/04/2017, 3:29 PM

## 2017-01-04 NOTE — Discharge Summary (Signed)
Physician Discharge Summary  [place] KeDorothy Drummonds ZOX:[id number] DOB: 10-May-1931 DOA: 12/28/2016  PCP: System, Pcp Not In  Admit date: 12/28/2016 Discharge date: 01/04/2017  Admitted From: Home.  Disposition:  Home.   Recommendations for Outpatient Follow-up:  1. Follow up with PCP in 1-2 weeks 2. Follow up with Dr Juanetta GoslingHawkins next week if still here.  With her doctors if she returns to [place].   Home Health:  None.  Equipment/Devices: she has a walker Discharge Condition: No audible wheezing, and no desat with ambulation.  CODE STATUS: FULL CODE.  Diet recommendation: cardiac diet.   Brief/Interim Summary:  Patient was admitted for fever and coughs, found to have PNA, and admitted by Dr Onalee Huaavid on Dec 29, 2016.  As per her H and P:  " [place] Curry is a 83 y.o. female with medical history significant of HTN, DM, COPD, CKD is here visiting with daughter for a month from [place].  The last couple of days she has been coughing and running fever today.  Per daughter she has no h/o dementia however she is on aricept.  Today she was really confused which was abnormal for her.  No n/v/d.  Former smoker.  No recent abx or hospitalizations.  Pt denies any pain.  She complains of  Cough only.  No urinary symptoms, no rashes.  Pt found to have pna and referred for admission for advanced age with pneumonia.  Review of Systems: As per HPI otherwise 10 point review of systems negative per patient and daughter.  HOSPITAL COURSE:  Patient was admitted for PNA, and she was given IV Rocephin and Zithromax.  She was given IV steroids, and nebs.  She was really slow to respond, and after a few days, since I couldn't discharge her due to persistent wheezing, pulmonary was consulted to assist and guided with treatment.  Dr Juanetta GoslingHawkins felt that she was slow to respond, and to continue with current Tx plan.  She was given Terbutaline, and tried one dose of IV lasix.  She had an ECHO done and at the time of discharge, the result is  still pending.   She improved today, and no longer has any wheezing.  She also did not require suplemental oxygen, even in the setting of exertion.  She is now stable for discharge.  She did have diarrhea for one day, and though the quick screen for C diff was positive, the PCR was negative, and her diarrhea improved.  She will be Tx for another 10 days of levoquin, and she will be given rapid steroid taper.  If she is still here in [place], I would recommend she sees Dr Juanetta GoslingHawkins in follow up.  If she returned to [place], she should follow up with her doctors there.  Thank you for the opportunity to particpate in her care.  Good Day.   Discharge Diagnoses:  Principal Problem:   PNA (pneumonia) Active Problems:   Delirium   COPD (chronic obstructive pulmonary disease) (HCC)   Diabetes mellitus without complication (HCC)   Hypertension   CAP (community acquired pneumonia)  Discharge Instructions  Discharge Instructions    Diet - low sodium heart healthy    Complete by:  As directed    Discharge instructions    Complete by:  As directed    Use your medication as instructed.  Follow up with Dr Juanetta GoslingHawkins if you are still here next week.  Follow up with your doctor in [place] if you left.   Increase activity slowly  Complete by:  As directed      Allergies as of 01/04/2017      Reactions   Doxycycline    unknown   Nitrofuran Derivatives    unknown   Penicillins    unknown   Sulfa Antibiotics    unknown      Medication List    STOP taking these medications   albuterol (2.5 MG/3ML) 0.083% nebulizer solution Commonly known as:  PROVENTIL   azelastine 0.1 % nasal spray Commonly known as:  ASTELIN   cetirizine 10 MG tablet Commonly known as:  ZYRTEC   chlorpheniramine 4 MG tablet Commonly known as:  CHLOR-TRIMETON   CORICIDIN COUGH/COLD 4-30 MG Tabs Generic drug:  Chlorpheniramine-DM   DIABETIC TUSSIN DM 10-100 MG/5ML liquid Generic drug:  dextromethorphan-guaiFENesin    NASACORT ALLERGY 24HR 55 MCG/ACT Aero nasal inhaler Generic drug:  triamcinolone     TAKE these medications   acetaminophen 325 MG tablet Commonly known as:  TYLENOL Take 2 tablets (650 mg total) by mouth every 6 (six) hours as needed for fever.   amLODipine 5 MG tablet Commonly known as:  NORVASC Take 5 mg by mouth every morning.   BIOTIN PO Take 1 capsule by mouth every evening.   CALCIUM 600 1500 (600 Ca) MG Tabs tablet Generic drug:  calcium carbonate Take 1 tablet by mouth 2 (two) times daily with a meal.   clopidogrel 75 MG tablet Commonly known as:  PLAVIX Take 75 mg by mouth every morning.   donepezil 5 MG tablet Commonly known as:  ARICEPT Take 5 mg by mouth every evening.   Fish Oil 1200 MG Caps Take 1 capsule by mouth every evening.   fluticasone furoate-vilanterol 100-25 MCG/INH Aepb Commonly known as:  BREO ELLIPTA Inhale 1 puff into the lungs daily. Start taking on:  01/05/2017   Fluticasone-Salmeterol 250-50 MCG/DOSE Aepb Commonly known as:  ADVAIR Inhale 1 puff into the lungs every morning. *May use once additional as needed   folic acid 800 MCG tablet Commonly known as:  FOLVITE Take 400 mcg by mouth every evening.   gabapentin 100 MG capsule Commonly known as:  NEURONTIN Take 100 mg by mouth at bedtime.   GNP ADULT ASPIRIN LOW STRENGTH 81 MG chewable tablet Generic drug:  aspirin Chew 81 mg by mouth every evening.   levofloxacin 500 MG tablet Commonly known as:  LEVAQUIN Take 1 tablet (500 mg total) by mouth daily.   magnesium oxide 400 MG tablet Commonly known as:  MAG-OX Take 400 mg by mouth every morning.   Melatonin 3 MG Tabs Take 3 mg by mouth at bedtime.   metFORMIN 500 MG tablet Commonly known as:  GLUCOPHAGE Take 250 mg by mouth daily as needed (for blood sugar levels).   methenamine 1 g tablet Commonly known as:  HIPREX Take 1 g by mouth every morning.   methotrexate 2.5 MG tablet Commonly known as:  RHEUMATREX Take  5 mg by mouth every Friday. Caution:Chemotherapy. Protect from light. EVENINGS ONLY   metolazone 5 MG tablet Commonly known as:  ZAROXOLYN Take 5 mg by mouth 2 (two) times a week. Mondays and Thursdays   metoprolol succinate 25 MG 24 hr tablet Commonly known as:  TOPROL-XL Take 25 mg by mouth every morning.   multivitamin with minerals Tabs tablet Take 1 tablet by mouth every morning.   potassium chloride 10 MEQ tablet Commonly known as:  K-DUR,KLOR-CON Take 10 mEq by mouth 2 (two) times daily.   ranitidine 150  MG tablet Commonly known as:  ZANTAC Take 150 mg by mouth 2 (two) times daily.   Red Yeast Rice 600 MG Caps Take 2 capsules by mouth every evening.   terbutaline 5 MG tablet Commonly known as:  BRETHINE Take 0.5 tablets (2.5 mg total) by mouth every 6 (six) hours.   traZODone 50 MG tablet Commonly known as:  DESYREL Take 50 mg by mouth at bedtime.   umeclidinium bromide 62.5 MCG/INH Aepb Commonly known as:  INCRUSE ELLIPTA Inhale 1 puff into the lungs daily. Start taking on:  01/05/2017       Allergies  Allergen Reactions  . Doxycycline     unknown  . Nitrofuran Derivatives     unknown  . Penicillins     unknown  . Sulfa Antibiotics     unknown    Consultations:  Pulmonary.    Procedures/Studies: Dg Chest 2 View  Result Date: 12/28/2016 CLINICAL DATA:  Cough.  Fever.  Diarrhea.  Concern for pneumonia. EXAM: CHEST  2 VIEW COMPARISON:  None. FINDINGS: The heart is normal in size. Mediastinal contours are normal, atherosclerosis of the thoracic aorta. Mild interstitial coarsening which may be chronic. Patchy consolidation at the left lung base. Possible small left pleural effusion. Linear atelectasis or scarring in the periphery of the right midlung. No pulmonary edema or pneumothorax. No acute osseous abnormalities. IMPRESSION: Patchy left basilar consolidation concerning for pneumonia. Followup PA and lateral chest X-ray is recommended in 3-4 weeks  following trial of antibiotic therapy to ensure resolution and exclude underlying malignancy. Thoracic aortic atherosclerosis. Electronically Signed   By: Rubye Oaks M.D.   On: 12/28/2016 21:36   Dg Wrist Complete Right  Result Date: 12/28/2016 CLINICAL DATA:  Right wrist pain after a fall today. EXAM: RIGHT WRIST - COMPLETE 3+ VIEW COMPARISON:  None. FINDINGS: Overlying IV catheter into being limits evaluation. Right wrist appears intact. No evidence of acute fracture or dislocation. No focal bone lesion or bone destruction. Mild degenerative changes in the STT and first carpometacarpal joints. Soft tissues are unremarkable. IMPRESSION: Mild degenerative changes in the right wrist. No acute bony abnormalities. Electronically Signed   By: Burman Nieves M.D.   On: 12/28/2016 22:39   Ct Head Wo Contrast  Result Date: 12/28/2016 CLINICAL DATA:  Altered mental status.  Fall several days prior EXAM: CT HEAD WITHOUT CONTRAST TECHNIQUE: Contiguous axial images were obtained from the base of the skull through the vertex without intravenous contrast. COMPARISON:  None. FINDINGS: Brain: There is moderate diffuse atrophy. There is asymmetric extra-axial fluid in the right frontal region compared to the left with localized impression on the right frontal lobe. This apparent chronic subdural hygroma has a maximum thickness from anterior to posterior projection on axial images of 14 mm. There is no acute hemorrhage in the intra-axial or extra-axial regions. There is no midline shift. No mass or edema evident. There is extensive small vessel disease in the centra semiovale bilaterally. No acute infarct evident. Vascular: No hyperdense vessel. There is calcification in each carotid siphon region. Skull: The bony calvarium appears intact. Sinuses/Orbits: There is mucosal thickening in several ethmoid air cells bilaterally. Other visualized paranasal sinuses are clear. Visualized orbits appear symmetric bilaterally.  Other: Visualized mastoid air cells are clear. IMPRESSION: Atrophy with fairly extensive periventricular small vessel disease. No intracranial mass, acute hemorrhage, or acute infarct. There is frontal atrophy bilaterally, asymmetric on the right with extra-axial fluid impressing on the right frontal lobe. It is felt that this finding  represents a chronic right frontal subdural hygroma. It has a maximum thickness on axial images from anterior to posterior projection 14 mm. No acute infarct in this area. No midline shift. There are foci of arteriovascular calcification. There is mucosal thickening in several ethmoid air cells. Electronically Signed   By: Bretta Bang III M.D.   On: 12/28/2016 21:41     Subjective:  Feeling better.   Discharge Exam: Vitals:   01/04/17 0527 01/04/17 1225  BP: (!) 142/84 (!) 127/54  Pulse: 90 77  Resp: 18 18  Temp: 98.7 F (37.1 C) 97.9 F (36.6 C)   Vitals:   01/04/17 0839 01/04/17 0840 01/04/17 1225 01/04/17 1433  BP:   (!) 127/54   Pulse:   77   Resp:   18   Temp:   97.9 F (36.6 C)   TempSrc:      SpO2: 95% 95% 96% 93%  Weight: 63.6 kg (140 lb 3.4 oz)     Height: 5\' 2"  (1.575 m)       General: Pt is alert, awake, not in acute distress Cardiovascular: RRR, S1/S2 +, no rubs, no gallops Respiratory: CTA bilaterally, no wheezing, no rhonchi Abdominal: Soft, NT, ND, bowel sounds + Extremities: no edema, no cyanosis    The results of significant diagnostics from this hospitalization (including imaging, microbiology, ancillary and laboratory) are listed below for reference.     Microbiology: Recent Results (from the past 240 hour(s))  Blood Culture (routine x 2)     Status: None   Collection Time: 12/28/16  8:54 PM  Result Value Ref Range Status   Specimen Description RIGHT ANTECUBITAL  Final   Special Requests   Final    BOTTLES DRAWN AEROBIC AND ANAEROBIC Blood Culture adequate volume   Culture   Final    NO GROWTH 5 DAYS Performed  at [facility], [address] [place], [place] [address]    Report Status 01/02/2017 FINAL  Final  Blood Culture (routine x 2)     Status: None   Collection Time: 12/28/16  9:06 PM  Result Value Ref Range Status   Specimen Description LEFT ANTECUBITAL  Final   Special Requests   Final    BOTTLES DRAWN AEROBIC ONLY Blood Culture adequate volume   Culture   Final    NO GROWTH 5 DAYS Performed at [facility], [address], [place], [place] [address]    Report Status 01/02/2017 FINAL  Final  C difficile quick scan w PCR reflex     Status: Abnormal   Collection Time: 01/03/17  3:52 PM  Result Value Ref Range Status   C Diff antigen POSITIVE (A) NEGATIVE Final   C Diff toxin NEGATIVE NEGATIVE Final   C Diff interpretation Results are indeterminate. See PCR results.  Final  Clostridium Difficile by PCR     Status: None   Collection Time: 01/03/17  3:52 PM  Result Value Ref Range Status   Toxigenic C Difficile by pcr NEGATIVE NEGATIVE Final    Comment: Patient is colonized with non toxigenic C. difficile. May not need treatment unless significant symptoms are present. Performed at [facility], [address], [place], [place] [address]      Labs: BNP (last 3 results)  Recent Labs  12/28/16 2054  BNP 58.0   Basic Metabolic Panel:  Recent Labs Lab 12/28/16 2054 12/29/16 0554  NA 136 139  K 3.6 3.2*  CL 104 106  CO2 23  25  GLUCOSE 156* 134*  BUN 15 15  CREATININE 0.81 0.74  CALCIUM 8.5* 7.9*  MG 1.8  --    Liver Function Tests:  Recent Labs Lab 12/28/16 2054 12/30/16 1301  AST 44* 40  ALT 26 30  ALKPHOS 69 62  BILITOT 1.5* 0.5  PROT 7.0 6.4*  ALBUMIN 3.6 3.0*    Recent Labs Lab 12/30/16 1301  LIPASE 31   No results for input(s): AMMONIA in the last 168 hours. CBC:  Recent Labs Lab 12/28/16 2054 12/29/16 0554  WBC 9.4 7.7  NEUTROABS 8.6* 6.5  HGB 13.4 11.6*  HCT 39.2 34.5*  MCV 94.0 95.0  PLT 210 198   Cardiac  Enzymes:  Recent Labs Lab 12/28/16 2054  TROPONINI 0.03*   BNP: Invalid input(s): POCBNP CBG:  Recent Labs Lab 01/03/17 1141 01/03/17 1622 01/03/17 2116 01/04/17 0744 01/04/17 1158  GLUCAP 214* 186* 225* 202* 226*   D-Dimer No results for input(s): DDIMER in the last 72 hours. Hgb A1c No results for input(s): HGBA1C in the last 72 hours. Lipid Profile No results for input(s): CHOL, HDL, LDLCALC, TRIG, CHOLHDL, LDLDIRECT in the last 72 hours. Thyroid function studies No results for input(s): TSH, T4TOTAL, T3FREE, THYROIDAB in the last 72 hours.  Invalid input(s): FREET3 Anemia work up No results for input(s): VITAMINB12, FOLATE, FERRITIN, TIBC, IRON, RETICCTPCT in the last 72 hours. Urinalysis    Component Value Date/Time   COLORURINE YELLOW 12/28/2016 2039   APPEARANCEUR HAZY (A) 12/28/2016 2039   LABSPEC 1.018 12/28/2016 2039   PHURINE 5.0 12/28/2016 2039   GLUCOSEU NEGATIVE 12/28/2016 2039   HGBUR MODERATE (A) 12/28/2016 2039   BILIRUBINUR NEGATIVE 12/28/2016 2039   KETONESUR 20 (A) 12/28/2016 2039   PROTEINUR 100 (A) 12/28/2016 2039   NITRITE NEGATIVE 12/28/2016 2039   LEUKOCYTESUR NEGATIVE 12/28/2016 2039   Sepsis Labs Invalid input(s): PROCALCITONIN,  WBC,  LACTICIDVEN Microbiology Recent Results (from the past 240 hour(s))  Blood Culture (routine x 2)     Status: None   Collection Time: 12/28/16  8:54 PM  Result Value Ref Range Status   Specimen Description RIGHT ANTECUBITAL  Final   Special Requests   Final    BOTTLES DRAWN AEROBIC AND ANAEROBIC Blood Culture adequate volume   Culture   Final    NO GROWTH 5 DAYS Performed at [facility] Lab, [address], [place], [place] [address]    Report Status 01/02/2017 FINAL  Final  Blood Culture (routine x 2)     Status: None   Collection Time: 12/28/16  9:06 PM  Result Value Ref Range Status   Specimen Description LEFT ANTECUBITAL  Final   Special Requests   Final    BOTTLES DRAWN AEROBIC ONLY  Blood Culture adequate volume   Culture   Final    NO GROWTH 5 DAYS Performed at [facility], [address], [place], [place] [address]    Report Status 01/02/2017 FINAL  Final  C difficile quick scan w PCR reflex     Status: Abnormal   Collection Time: 01/03/17  3:52 PM  Result Value Ref Range Status   C Diff antigen POSITIVE (A) NEGATIVE Final   C Diff toxin NEGATIVE NEGATIVE Final   C Diff interpretation Results are indeterminate. See PCR results.  Final  Clostridium Difficile by PCR     Status: None   Collection Time: 01/03/17  3:52 PM  Result Value Ref Range Status   Toxigenic C Difficile by pcr  NEGATIVE NEGATIVE Final    Comment: Patient is colonized with non toxigenic C. difficile. May not need treatment unless significant symptoms are present. Performed at [facility] Lab, [address] [place], [place] [address]      Time coordinating discharge: Over 30 minutes  SIGNED:   Houston Siren, MD FACP [facility] 01/04/2017, 4:03 PM   If 7PM-7AM, please contact night-coverage [email or web address] Password [email or web address]

## 2017-01-04 NOTE — Progress Notes (Signed)
Subjective: She says she feels better. She tells me today that she feels like she's back to her baseline as far as her breathing is concerned. She's not coughing as much. However she did have an IV infiltrate and she has swelling of her left hand.  Objective: Vital signs in last 24 hours: Temp:  [97.6 F (36.4 C)-98.7 F (37.1 C)] 98.7 F (37.1 C) (05/15 0527) Pulse Rate:  [57-90] 90 (05/15 0527) Resp:  [18-21] 18 (05/15 0527) BP: (136-153)/(52-84) 142/84 (05/15 0527) SpO2:  [90 %-98 %] 94 % (05/15 0527) Weight change:  Last BM Date: 01/03/17  Intake/Output from previous day: 05/14 0701 - 05/15 0700 In: 600 [P.O.:600] Out: 700 [Urine:700]  PHYSICAL EXAM General appearance: alert, cooperative and no distress Resp: clear to auscultation bilaterally Cardio: regular rate and rhythm, S1, S2 normal, no murmur, click, rub or gallop GI: soft, non-tender; bowel sounds normal; no masses,  no organomegaly Extremities: She has swelling of her left hand Mucous membranes are moist  Lab Results:  Results for orders placed or performed during the hospital encounter of 12/28/16 (from the past 48 hour(s))  Glucose, capillary     Status: Abnormal   Collection Time: 01/02/17 11:33 AM  Result Value Ref Range   Glucose-Capillary 159 (H) 65 - 99 mg/dL  Glucose, capillary     Status: Abnormal   Collection Time: 01/02/17  4:04 PM  Result Value Ref Range   Glucose-Capillary 182 (H) 65 - 99 mg/dL   Comment 1 Notify RN    Comment 2 Document in Chart   Glucose, capillary     Status: Abnormal   Collection Time: 01/02/17  9:04 PM  Result Value Ref Range   Glucose-Capillary 179 (H) 65 - 99 mg/dL   Comment 1 Notify RN    Comment 2 Document in Chart   Glucose, capillary     Status: Abnormal   Collection Time: 01/03/17  8:16 AM  Result Value Ref Range   Glucose-Capillary 191 (H) 65 - 99 mg/dL   Comment 1 Notify RN    Comment 2 Document in Chart   Glucose, capillary     Status: Abnormal   Collection Time: 01/03/17 11:41 AM  Result Value Ref Range   Glucose-Capillary 214 (H) 65 - 99 mg/dL   Comment 1 Notify RN    Comment 2 Document in Chart   Glucose, capillary     Status: Abnormal   Collection Time: 01/03/17  4:22 PM  Result Value Ref Range   Glucose-Capillary 186 (H) 65 - 99 mg/dL   Comment 1 Notify RN    Comment 2 Document in Chart   Glucose, capillary     Status: Abnormal   Collection Time: 01/03/17  9:16 PM  Result Value Ref Range   Glucose-Capillary 225 (H) 65 - 99 mg/dL  Glucose, capillary     Status: Abnormal   Collection Time: 01/04/17  7:44 AM  Result Value Ref Range   Glucose-Capillary 202 (H) 65 - 99 mg/dL    ABGS No results for input(s): PHART, PO2ART, TCO2, HCO3 in the last 72 hours.  Invalid input(s): PCO2 CULTURES Recent Results (from the past 240 hour(s))  Blood Culture (routine x 2)     Status: None   Collection Time: 12/28/16  8:54 PM  Result Value Ref Range Status   Specimen Description RIGHT ANTECUBITAL  Final   Special Requests   Final    BOTTLES DRAWN AEROBIC AND ANAEROBIC Blood Culture adequate volume   Culture  Final    NO GROWTH 5 DAYS Performed at [facility], [address] [place], [place] [address]    Report Status 01/02/2017 FINAL  Final  Blood Culture (routine x 2)     Status: None   Collection Time: 12/28/16  9:06 PM  Result Value Ref Range Status   Specimen Description LEFT ANTECUBITAL  Final   Special Requests   Final    BOTTLES DRAWN AEROBIC ONLY Blood Culture adequate volume   Culture   Final    NO GROWTH 5 DAYS Performed at [facility], [address] [place], [place] [address]    Report Status 01/02/2017 FINAL  Final   Studies/Results: No results found.  Medications:  Prior to Admission:  Prescriptions Prior to Admission  Medication Sig Dispense Refill Last Dose  . albuterol (PROVENTIL) (2.5 MG/3ML) 0.083% nebulizer solution Take 2.5 mg by nebulization every 6 (six) hours as needed for  wheezing or shortness of breath.   unknown  . amLODipine (NORVASC) 5 MG tablet Take 5 mg by mouth every morning.   12/28/2016 at Unknown time  . aspirin (GNP ADULT ASPIRIN LOW STRENGTH) 81 MG chewable tablet Chew 81 mg by mouth every evening.   12/27/2016 at Unknown time  . azelastine (ASTELIN) 0.1 % nasal spray Place 2 sprays into both nostrils at bedtime as needed for rhinitis. Use in each nostril as directed   unknown  . BIOTIN PO Take 1 capsule by mouth every evening.   12/27/2016 at Unknown time  . calcium carbonate (CALCIUM 600) 1500 (600 Ca) MG TABS tablet Take 1 tablet by mouth 2 (two) times daily with a meal.   12/28/2016 at Unknown time  . cetirizine (ZYRTEC) 10 MG tablet Take 10 mg by mouth every morning.   12/28/2016 at Unknown time  . chlorpheniramine (CHLOR-TRIMETON) 4 MG tablet Take 4 mg by mouth at bedtime.   12/27/2016 at Unknown time  . Chlorpheniramine-DM (CORICIDIN COUGH/COLD) 4-30 MG TABS Take 1-2 tablets by mouth daily as needed (coldand cough).   12/28/2016 at Unknown time  . clopidogrel (PLAVIX) 75 MG tablet Take 75 mg by mouth every morning.   12/28/2016 at Unknown time  . dextromethorphan-guaiFENesin (DIABETIC TUSSIN DM) 10-100 MG/5ML liquid Take 5 mLs by mouth every 4 (four) hours as needed for cough.   Past Week at Unknown time  . donepezil (ARICEPT) 5 MG tablet Take 5 mg by mouth every evening.   12/27/2016 at Unknown time  . Fluticasone-Salmeterol (ADVAIR) 250-50 MCG/DOSE AEPB Inhale 1 puff into the lungs every morning. *May use once additional as needed   12/28/2016 at Unknown time  . folic acid (FOLVITE) 800 MCG tablet Take 400 mcg by mouth every evening.   12/27/2016 at Unknown time  . gabapentin (NEURONTIN) 100 MG capsule Take 100 mg by mouth at bedtime.   12/27/2016 at Unknown time  . magnesium oxide (MAG-OX) 400 MG tablet Take 400 mg by mouth every morning.   12/28/2016 at Unknown time  . Melatonin 3 MG TABS Take 3 mg by mouth at bedtime.   12/27/2016 at Unknown time  . metFORMIN  (GLUCOPHAGE) 500 MG tablet Take 250 mg by mouth daily as needed (for blood sugar levels).   unknown  . methenamine (HIPREX) 1 g tablet Take 1 g by mouth every morning.   12/28/2016 at Unknown time  . methotrexate (RHEUMATREX) 2.5 MG tablet Take 5 mg by mouth every Friday. Caution:Chemotherapy. Protect from light. EVENINGS ONLY   12/24/2016 at  Unknown time  . metolazone (ZAROXOLYN) 5 MG tablet Take 5 mg by mouth 2 (two) times a week. Mondays and Thursdays   12/27/2016 at Unknown time  . metoprolol succinate (TOPROL-XL) 25 MG 24 hr tablet Take 25 mg by mouth every morning.   12/28/2016 at Unknown time  . Multiple Vitamin (MULTIVITAMIN WITH MINERALS) TABS tablet Take 1 tablet by mouth every morning.   12/28/2016 at Unknown time  . Omega-3 Fatty Acids (FISH OIL) 1200 MG CAPS Take 1 capsule by mouth every evening.   12/27/2016 at Unknown time  . potassium chloride (K-DUR,KLOR-CON) 10 MEQ tablet Take 10 mEq by mouth 2 (two) times daily.   12/28/2016 at Unknown time  . ranitidine (ZANTAC) 150 MG tablet Take 150 mg by mouth 2 (two) times daily.   12/28/2016 at Unknown time  . Red Yeast Rice 600 MG CAPS Take 2 capsules by mouth every evening.   12/27/2016 at Unknown time  . traZODone (DESYREL) 50 MG tablet Take 50 mg by mouth at bedtime.   12/27/2016 at Unknown time  . triamcinolone (NASACORT ALLERGY 24HR) 55 MCG/ACT AERO nasal inhaler Place 2 sprays into the nose daily as needed (for allergies).   unknown   Scheduled: . albuterol  2.5 mg Nebulization Q6H  . amLODipine  5 mg Oral q morning - 10a  . aspirin  81 mg Oral QPM  . clopidogrel  75 mg Oral q morning - 10a  . fluticasone furoate-vilanterol  1 puff Inhalation Daily  . gabapentin  100 mg Oral QHS  . guaiFENesin  1,200 mg Oral BID  . insulin aspart  0-9 Units Subcutaneous TID WC  . mouth rinse  15 mL Mouth Rinse BID  . methylPREDNISolone (SOLU-MEDROL) injection  40 mg Intravenous Q6H  . metolazone  5 mg Oral Once per day on Mon Thu  . metoprolol succinate  25 mg  Oral q morning - 10a  . potassium chloride  20 mEq Oral Daily  . sodium chloride flush  3 mL Intravenous Q12H  . terbutaline  2.5 mg Oral Q6H  . traZODone  50 mg Oral QHS  . umeclidinium bromide  1 puff Inhalation Daily   Continuous: . sodium chloride Stopped (12/31/16 0233)  . azithromycin Stopped (01/04/17 0043)  . cefTRIAXone (ROCEPHIN)  IV Stopped (01/03/17 2212)   ZOX:WRUEAV chloride, acetaminophen, azelastine, HYDROcodone-homatropine, ondansetron (ZOFRAN) IV, sodium chloride flush  Assesment: She was admitted with pneumonia. She has COPD exacerbation. She had a fairly slow response to treatment but she is better now. I think from a pulmonary point of view she could be discharged. She does have some swelling of her hand. Principal Problem:   PNA (pneumonia) Active Problems:   Delirium   COPD (chronic obstructive pulmonary disease) (HCC)   Diabetes mellitus without complication (HCC)   Hypertension   CAP (community acquired pneumonia)    Plan: I will plan to sign off. Thanks for allowing me to see her with you    LOS: 6 days   Oliver Heitzenrater L 01/04/2017, 8:33 AM

## 2017-01-04 NOTE — Progress Notes (Signed)
Discharge instructions gone over with patient, verbalized understanding. Printed prescription given to patient. IV removed, patient tolerated procedure well. Patient is awaiting daughter's arrival at this time to have transport home.

## 2017-01-04 NOTE — Progress Notes (Signed)
Inpatient Diabetes Program Recommendations  AACE/ADA: New Consensus Statement on Inpatient Glycemic Control (2015)  Target Ranges:  Prepandial:   less than 140 mg/dL      Peak postprandial:   less than 180 mg/dL (1-2 hours)      Critically ill patients:  140 - 180 mg/dL   Lab Results  Component Value Date   GLUCAP 202 (H) 01/04/2017    Review of Glycemic Control:  Results for Madison Curry, Madison Curry (MRN [id number]) as of 01/04/2017 10:14  Ref. Range 01/03/2017 08:16 01/03/2017 11:41 01/03/2017 16:22 01/03/2017 21:16 01/04/2017 07:44  Glucose-Capillary Latest Ref Range: 65 - 99 mg/dL 409191 (H) 811214 (H) 914186 (H) 225 (H) 202 (H)   Inpatient Diabetes Program Recommendations:    Consider increasing Novolog correction to moderate tid with meals and HS.   Thanks, Beryl MeagerJenny Koby Pickup, RN, BC-ADM Inpatient Diabetes Coordinator Pager [phone] (8a-5p)

## 2017-01-04 NOTE — Progress Notes (Signed)
Called to pt room for complaint of pain and swelling in L hand around IV site. IV removed from hand and hand wrapped in guaze with heat pack applied. New IV started in R forearm. Pt tolerated well.

## 2017-01-05 NOTE — Care Management Important Message (Signed)
Important Message  Patient Details  Name: Madison Curry MRN: [id number] Date of Birth: Jul 25, 1931   Medicare Important Message Given:  Yes    Malcolm Metrohildress, Saretta Dahlem Demske, RN

## 2017-01-05 NOTE — Care Management Note (Signed)
Case Management Note  Patient Details  Name: Madison Curry MRN: [id number] Date of Birth: 08-29-1930  Expected Discharge Date:  01/04/17               Expected Discharge Plan:  Home/Self Care  In-House Referral:  NA  Discharge planning Services  CM Consult  Post Acute Care Choice:  NA Choice offered to:  NA Status of Service:  Completed, signed off  If discussed at Long Length of Stay Meetings, dates discussed:    Additional Comments: Late entry: pt discharged home 01/04/2017 with self care. [place]aying with [place] until [place] [facility] Pt did not qualify for home oxygen. She ambulated with staff, no HH or DME needs.   Madison Curry, Madison Lovejoy Demske, RN 01/05/2017, 7:55 AM

## 2017-01-19 ENCOUNTER — Ambulatory Visit (HOSPITAL_COMMUNITY)
Admission: RE | Admit: 2017-01-19 | Discharge: 2017-01-19 | Disposition: A | Discharge status: Home / Self Care | Payer: Medicare Other | Source: Ambulatory Visit | Attending: Pulmonary Disease | Admitting: Pulmonary Disease

## 2017-01-19 ENCOUNTER — Other Ambulatory Visit (HOSPITAL_COMMUNITY): Payer: Self-pay | Admitting: Pulmonary Disease

## 2017-01-19 DIAGNOSIS — R918 Other nonspecific abnormal finding of lung field: Secondary | ICD-10-CM | POA: Diagnosis not present

## 2017-01-19 DIAGNOSIS — B59 Pneumocystosis: Secondary | ICD-10-CM

## 2017-01-19 DIAGNOSIS — I700 Atherosclerosis of aorta: Secondary | ICD-10-CM | POA: Insufficient documentation

## 2018-02-01 IMAGING — DX DG CHEST 2V
2 series · 2 of 2 positions shown · non-contrast
Comparison: PA and lateral chest x-ray December 28, 2016

CLINICAL DATA: Follow-up of previous episode of pneumonia.
Persistent shortness of breath.

EXAM:
CHEST  2 VIEW

[chest pa]
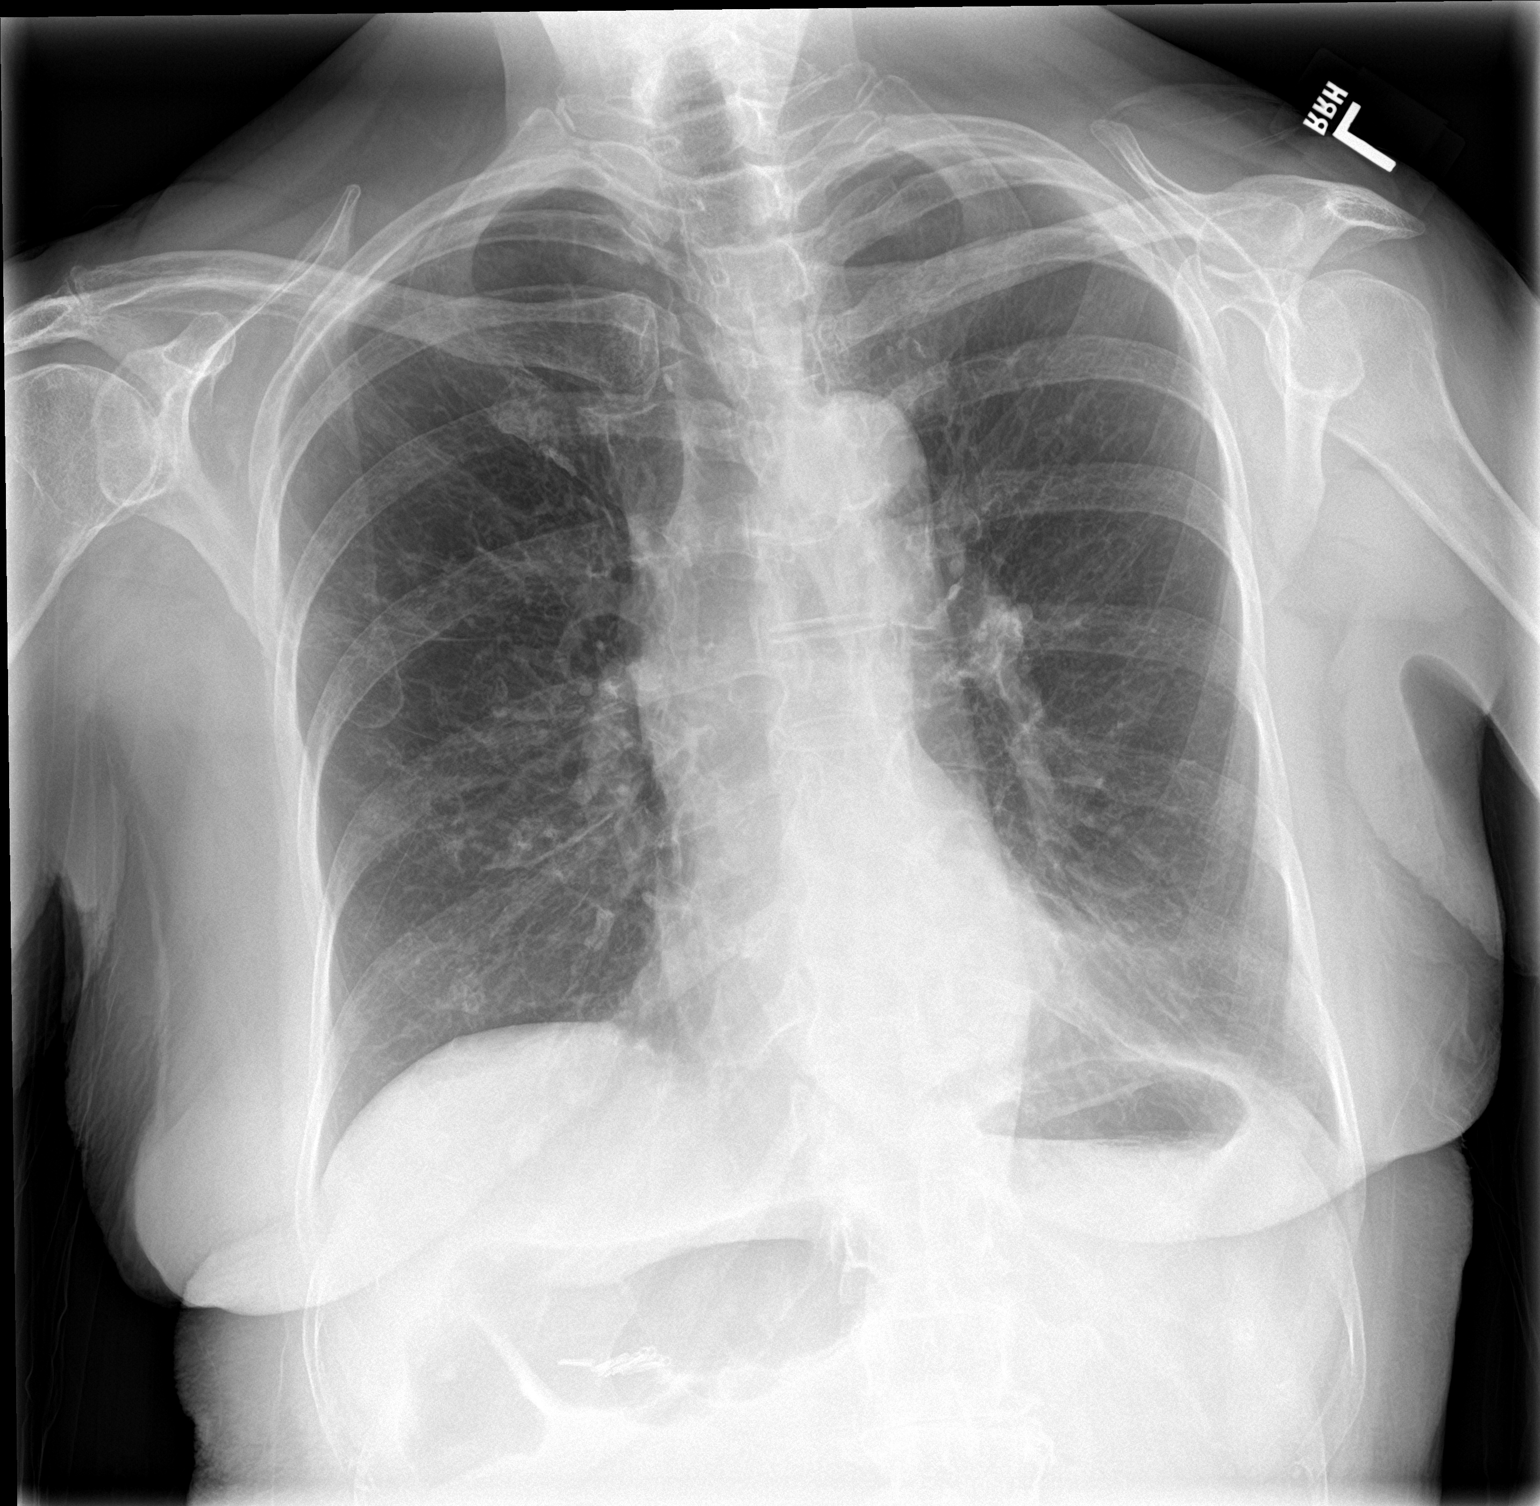

[chest lat]
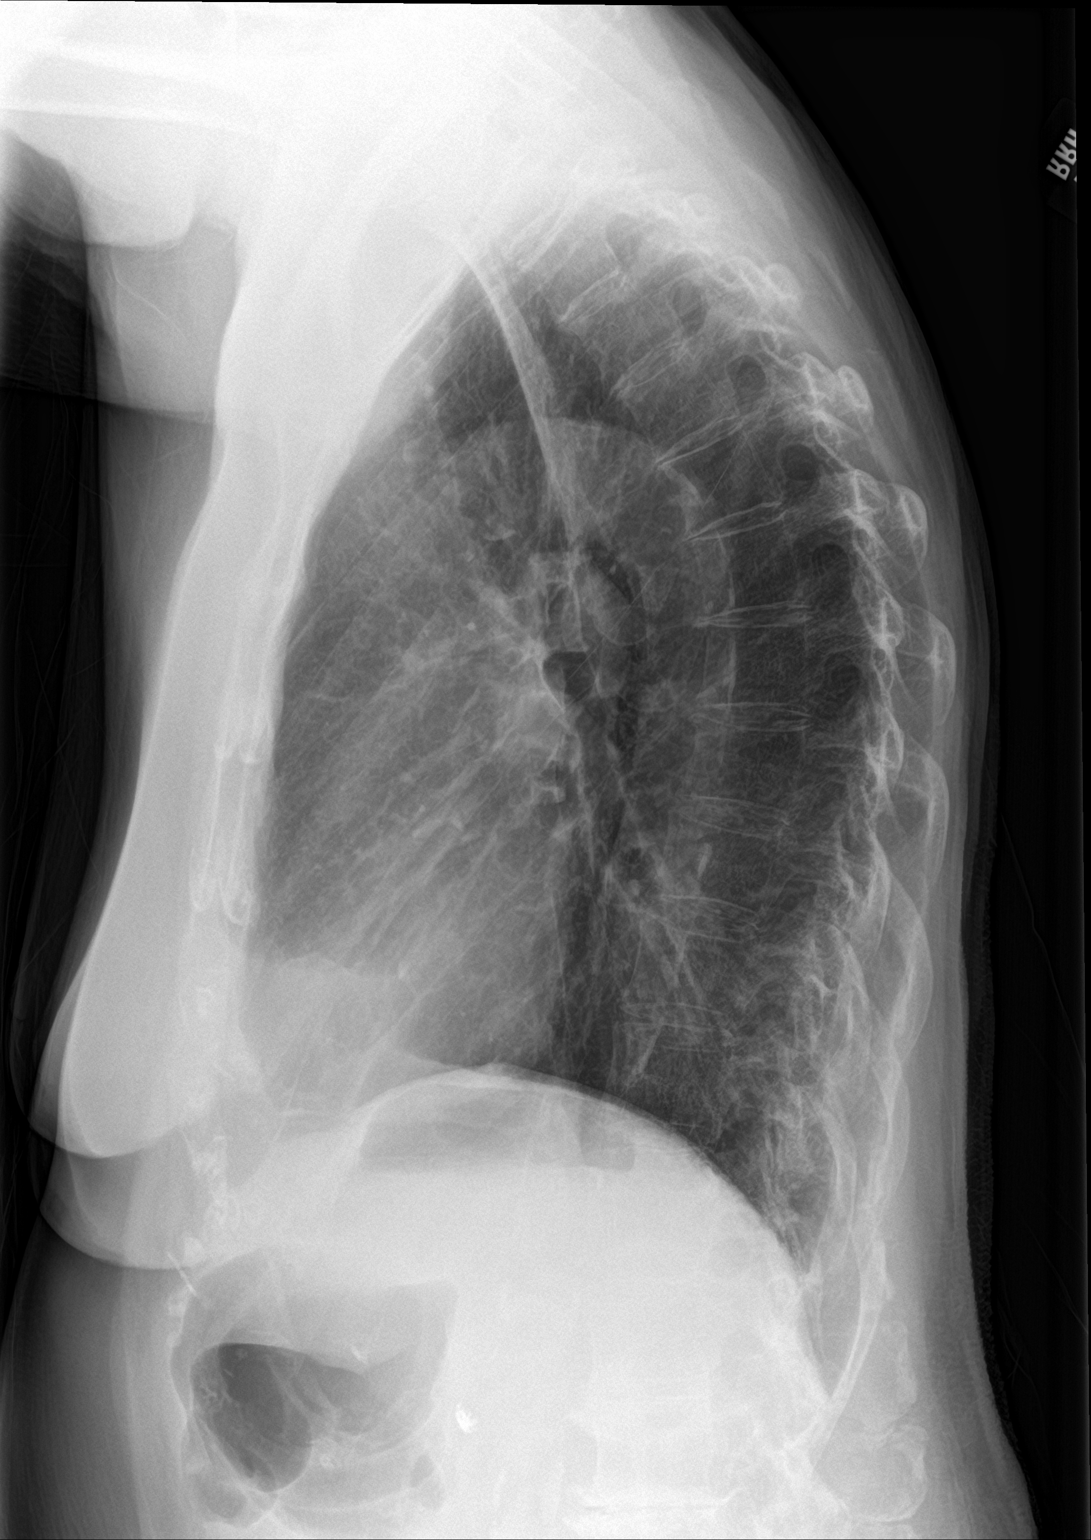

[2 of 2 positions shown; findings below may reference images not displayed]

FINDINGS: There has been interval improvement in the appearance of the left
lower lobe. Persistent subtle density in the lingula is noted. There
is no pleural effusion. The right lung is clear. The heart and
pulmonary vascularity are normal. There is calcification in the wall
of the aortic arch and descending thoracic aorta. The bony thorax is
unremarkable.
IMPRESSION: Improved appearance of the left lower lobe. Persistent increased
density in the lingula which may be chronic. An additional follow-up
chest x-ray in 2-3 weeks is recommended to assure complete clearing.
If the patient's symptoms worsen in the interim, chest CT scanning
would be the most useful next imaging step.

Thoracic aortic atherosclerosis.
